# Patient Record
Sex: Male | Born: 1955 | Race: White | Hispanic: No | Marital: Single | Smoking: Former smoker
Health system: Southern US, Community
[De-identification: ages and names within clinical notes are randomized; demographics above are authoritative.]

## PROBLEM LIST (undated history)

## (undated) DIAGNOSIS — I10 Essential (primary) hypertension: Secondary | ICD-10-CM

## (undated) DIAGNOSIS — I5020 Unspecified systolic (congestive) heart failure: Secondary | ICD-10-CM

## (undated) DIAGNOSIS — E785 Hyperlipidemia, unspecified: Secondary | ICD-10-CM

## (undated) DIAGNOSIS — I214 Non-ST elevation (NSTEMI) myocardial infarction: Secondary | ICD-10-CM

## (undated) DIAGNOSIS — I4891 Unspecified atrial fibrillation: Secondary | ICD-10-CM

## (undated) DIAGNOSIS — I2510 Atherosclerotic heart disease of native coronary artery without angina pectoris: Secondary | ICD-10-CM

## (undated) HISTORY — DX: Non-ST elevation (NSTEMI) myocardial infarction: I21.4

## (undated) HISTORY — DX: Hyperlipidemia, unspecified: E78.5

## (undated) HISTORY — DX: Essential (primary) hypertension: I10

## (undated) HISTORY — DX: Atherosclerotic heart disease of native coronary artery without angina pectoris: I25.10

## (undated) HISTORY — DX: Unspecified atrial fibrillation: I48.91

## (undated) HISTORY — DX: Unspecified systolic (congestive) heart failure: I50.20

---

## 2013-07-31 HISTORY — PX: CARDIAC CATHETERIZATION: SHX172

## 2014-01-01 ENCOUNTER — Emergency Department: Payer: Self-pay | Admitting: Emergency Medicine

## 2014-01-01 LAB — TROPONIN I: Troponin-I: <0.02 ng/mL

## 2014-01-01 LAB — CBC
HCT: 45.3 % (ref 40.0–52.0)
HGB: 15.2 g/dL (ref 13.0–18.0)
MCH: 31.1 pg (ref 26.0–34.0)
MCHC: 33.5 g/dL (ref 32.0–36.0)
MCV: 93 fL (ref 80–100)
PLATELETS: 227 10*3/uL (ref 150–440)
RBC: 4.88 10*6/uL (ref 4.40–5.90)
RDW: 13.3 % (ref 11.5–14.5)
WBC: 11.3 10*3/uL — ABNORMAL HIGH (ref 3.8–10.6)

## 2014-01-01 LAB — BASIC METABOLIC PANEL
Anion Gap: 3 — ABNORMAL LOW (ref 7–16)
BUN: 10 mg/dL (ref 7–18)
CALCIUM: 8.9 mg/dL (ref 8.5–10.1)
CO2: 27 mmol/L (ref 21–32)
CREATININE: 0.83 mg/dL (ref 0.60–1.30)
Chloride: 108 mmol/L — ABNORMAL HIGH (ref 98–107)
EGFR (African American): >60
EGFR (Non-African Amer.): >60
GLUCOSE: 102 mg/dL — AB (ref 65–99)
OSMOLALITY: 275 (ref 275–301)
POTASSIUM: 3.7 mmol/L (ref 3.5–5.1)
Sodium: 138 mmol/L (ref 136–145)

## 2014-01-01 LAB — URINALYSIS, COMPLETE
BILIRUBIN, UR: NEGATIVE
Bacteria: NONE SEEN
GLUCOSE, UR: NEGATIVE mg/dL (ref 0–75)
Ketone: NEGATIVE
LEUKOCYTE ESTERASE: NEGATIVE
Nitrite: NEGATIVE
Ph: 6 (ref 4.5–8.0)
Protein: NEGATIVE
SPECIFIC GRAVITY: 1.003 (ref 1.003–1.030)
SQUAMOUS EPITHELIAL: NONE SEEN

## 2014-02-28 HISTORY — PX: CORONARY ARTERY BYPASS GRAFT: SHX141

## 2014-03-20 ENCOUNTER — Inpatient Hospital Stay: Payer: Self-pay | Admitting: Cardiology

## 2014-03-20 LAB — BASIC METABOLIC PANEL
Anion Gap: 10 (ref 7–16)
BUN: 13 mg/dL (ref 7–18)
CO2: 23 mmol/L (ref 21–32)
Calcium, Total: 8 mg/dL — ABNORMAL LOW (ref 8.5–10.1)
Chloride: 108 mmol/L — ABNORMAL HIGH (ref 98–107)
Creatinine: 0.81 mg/dL (ref 0.60–1.30)
EGFR (Non-African Amer.): >60
Glucose: 130 mg/dL — ABNORMAL HIGH (ref 65–99)
Osmolality: 283 (ref 275–301)
POTASSIUM: 3.2 mmol/L — AB (ref 3.5–5.1)
Sodium: 141 mmol/L (ref 136–145)

## 2014-03-20 LAB — CBC
HCT: 40.1 % (ref 40.0–52.0)
HGB: 13.6 g/dL (ref 13.0–18.0)
MCH: 30.7 pg (ref 26.0–34.0)
MCHC: 33.9 g/dL (ref 32.0–36.0)
MCV: 90 fL (ref 80–100)
Platelet: 285 10*3/uL (ref 150–440)
RBC: 4.43 10*6/uL (ref 4.40–5.90)
RDW: 13.2 % (ref 11.5–14.5)
WBC: 12.1 10*3/uL — ABNORMAL HIGH (ref 3.8–10.6)

## 2014-03-20 LAB — PROTIME-INR
INR: 1
Prothrombin Time: 13.2 secs (ref 11.5–14.7)

## 2014-03-20 LAB — TROPONIN I
TROPONIN-I: 0.17 ng/mL — AB
Troponin-I: <0.02 ng/mL

## 2014-03-20 LAB — APTT: Activated PTT: 32.6 secs (ref 23.6–35.9)

## 2014-03-20 LAB — CK-MB: CK-MB: 2.6 ng/mL (ref 0.5–3.6)

## 2014-03-25 DIAGNOSIS — I2510 Atherosclerotic heart disease of native coronary artery without angina pectoris: Secondary | ICD-10-CM

## 2014-03-25 HISTORY — DX: Atherosclerotic heart disease of native coronary artery without angina pectoris: I25.10

## 2014-09-10 ENCOUNTER — Ambulatory Visit: Payer: Self-pay | Admitting: Internal Medicine

## 2014-11-06 ENCOUNTER — Ambulatory Visit
Admit: 2014-11-06 | Disposition: A | Discharge status: Home / Self Care | Payer: Self-pay | Attending: Internal Medicine | Admitting: Internal Medicine

## 2014-11-21 NOTE — Consult Note (Signed)
PATIENT NAME:  James [name], James [name] MR#:  [id number] DATE OF BIRTH:  Jun 27, 1956  DATE OF CONSULTATION:  03/20/2014  PRIMARY CARE PHYSICIAN:  Dr.  Thedore MinsSingh    CONSULTING PHYSICIAN:  Marcina MillardAlexander Toa Mia, MD  CHIEF COMPLAINT: Chest pain.   HISTORY OF PRESENT ILLNESS: The patient is a 59 year old gentleman with history of hypertension.  The patient has no prior cardiac history. He was in his usual state of health until today when he experienced substernal chest discomfort and presented to [facility] Room.  EKG revealed ST elevations in the anterior leads consistent with ST elevation myocardial infarction.   PAST MEDICAL HISTORY:  Hypertension.   MEDICATIONS: The patient reports blood pressure medication.   SOCIAL HISTORY: The patient is single.  The patient does smoke a pack of cigarettes a day.   FAMILY HISTORY: No immediate family history of coronary artery disease or myocardial infarction.   REVIEW OF SYSTEMS:  CONSTITUTIONAL: No fever or chills.  EYES: No blurry vision.  EARS: No hearing loss.  RESPIRATORY: No shortness of breath.  CARDIOVASCULAR: Chest pain as described above.  GASTROINTESTINAL: No nausea, vomiting, or diarrhea.  GENITOURINARY: No dysuria or hematuria.  ENDOCRINE: No polyuria or polydipsia.  MUSCULOSKELETAL: No arthralgias or myalgias.  NEUROLOGICAL: No focal muscle weakness or numbness.  PSYCHOLOGICAL: No depression or anxiety.   PHYSICAL EXAMINATION:  VITAL SIGNS: Blood pressure 122/67, pulse 84.  HEENT: Pupils equal and reactive to light and accommodation.  NECK: Supple without thyromegaly.  LUNGS: Clear.  CARDIOVASCULAR:  Normal JVP. Normal PMI. Regular rate and rhythm. Normal S1, S2. No appreciable gallop, murmur, or rub.  ABDOMEN: Soft and nontender. Pulses were intact bilaterally.  MUSCULOSKELETAL: Normal muscle tone.  NEUROLOGIC: The patient is alert and oriented x 3. Motor and sensory both grossly intact.   IMPRESSION: A 59 year old gentleman with new  onset chest pain with EKG consistent with anterior ST elevation myocardial infarction.   RECOMMENDATIONS:   Proceed directly to cardiac catheterization for immediate angiogram and possible primary percutaneous coronary intervention.  The risks, benefits, and alternatives were explained to the patient and informed written consent was obtained.      ____________________________ Marcina MillardAlexander Jamoni Broadfoot, MD ap:DT D: 03/20/2014 15:53:58 ET T: 03/20/2014 16:40:06 ET JOB#: [id number]  cc: Marcina MillardAlexander Zaryia Markel, MD, <Dictator> Marcina MillardALEXANDER Garek Schuneman MD ELECTRONICALLY SIGNED 04/02/2014 16:00

## 2014-11-21 NOTE — Discharge Summary (Signed)
PATIENT NAME:  James [name], James [name] MR#:  [id number] DATE OF BIRTH:  05-22-1956  DATE OF ADMISSION:  03/20/2014 DATE OF DISCHARGE:  03/20/2014  PRIMARY CARE PHYSICIAN: Dr. Thedore MinsSingh  FINAL DIAGNOSES: 1.  Anterior ST-elevation myocardial infarction.  2.  Hypertension.   PROCEDURE: Cardiac catheterization on 03/20/2014.   HISTORY OF PRESENT ILLNESS: Please see admission [name] and P.   HOSPITAL COURSE: The patient presented to [facility] Room on 03/20/2014 with prolonged episode of chest pain with EKG revealing ST elevations in the anterior leads consistent with ST-elevation myocardial infarction. Cardiac catheterization was performed, which revealed high-grade stenosis in the distal left main and ostium of the left anterior descending coronary artery with TIMI-3 flow. It was felt that the patient would be best treated with coronary artery bypass graft surgery. The [facility] in [facility] condition.   ____________________________ Marcina MillardAlexander [name], MD ap:sb D: 04/16/2014 10:33:38 ET T: 04/16/2014 12:04:46 ET JOB#: [id number]  cc: Marcina MillardAlexander Kwan Shellhammer, MD, <Dictator> Marcina MillardALEXANDER Jazmene Racz MD ELECTRONICALLY SIGNED 04/28/2014 12:15

## 2016-02-16 IMAGING — CT CT HEAD WITHOUT CONTRAST
1 series · 16 of 30 positions shown, 20 images · non-contrast
Comparison: None.

CLINICAL DATA: Dizziness

EXAM:
CT HEAD WITHOUT CONTRAST
TECHNIQUE: Contiguous axial images were obtained from the base of the skull
through the vertex without intravenous contrast.

[Series 2: head wo · axial · 0.42mm/px · z∈[+237,+363]mm · 16 of 32 slices shown, 20 images]
[im 2/32  brain]
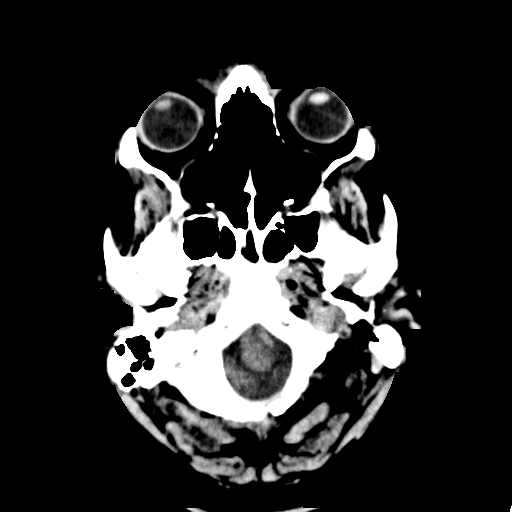
[im 2/32  bone]
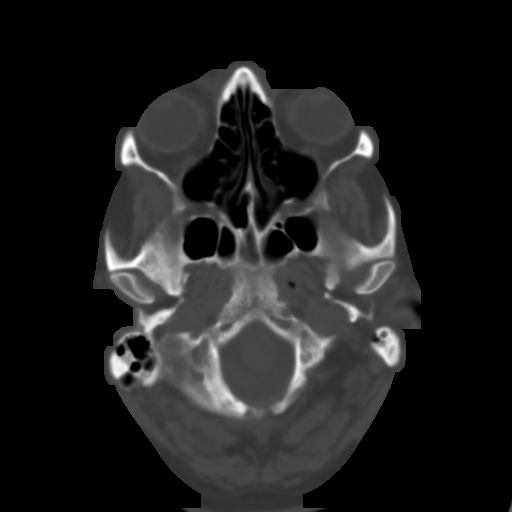
[im 4/32  brain]
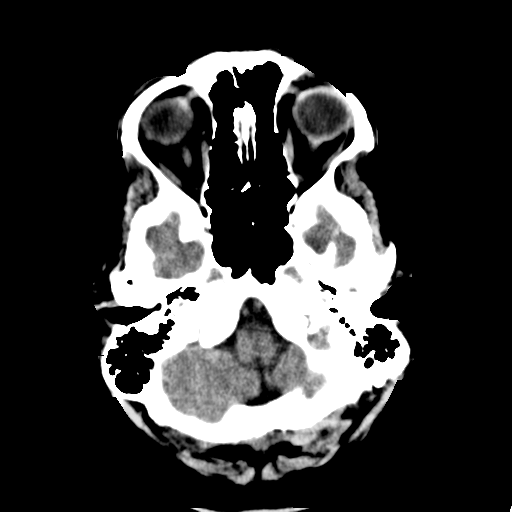
[im 6/32  brain]
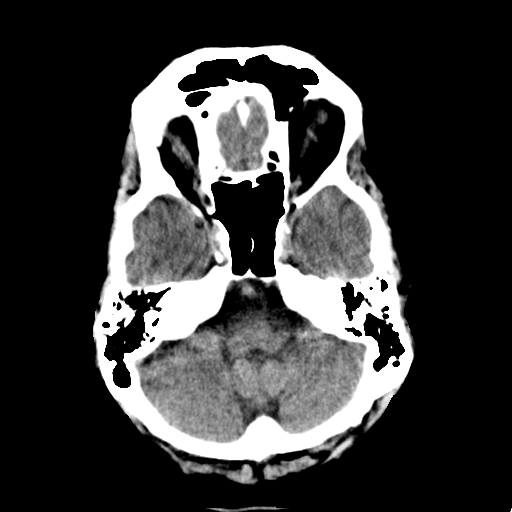
[im 8/32  brain]
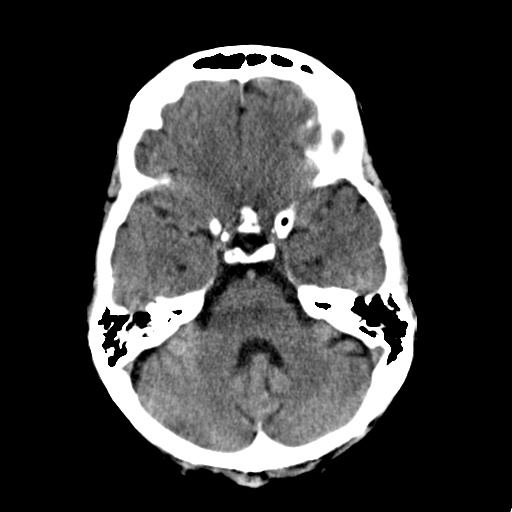
[im 9/32  brain]
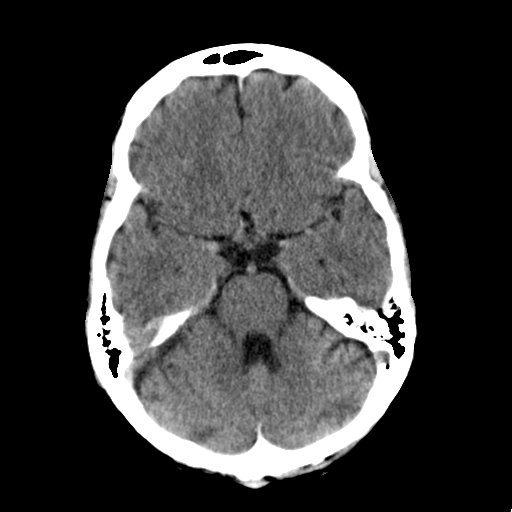
[im 9/32  bone]
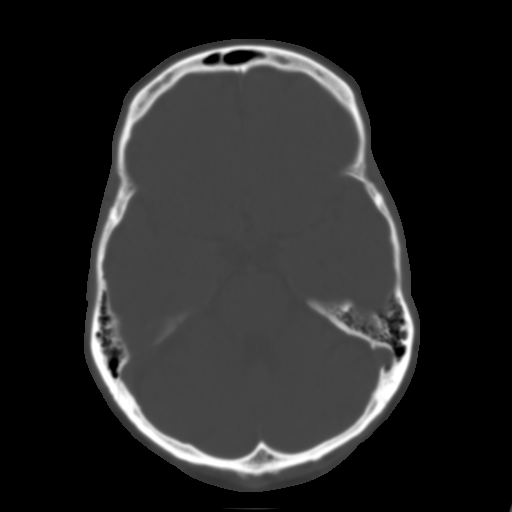
[im 11/32  brain]
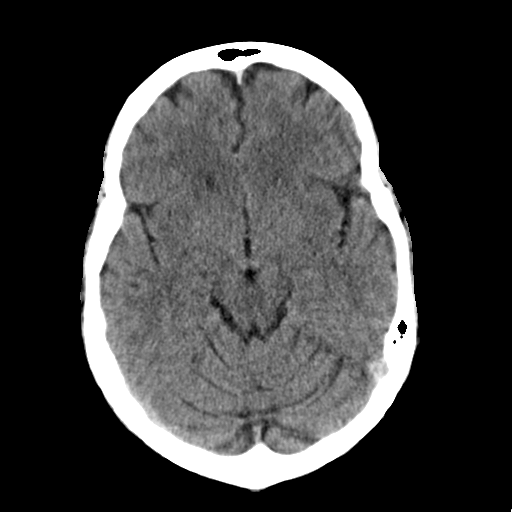
[im 13/32  brain]
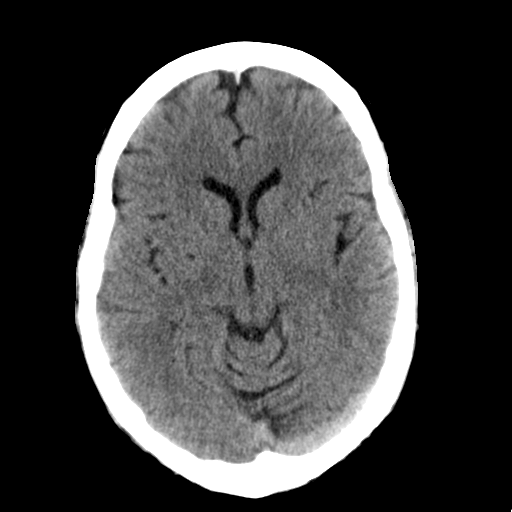
[im 15/32  brain]
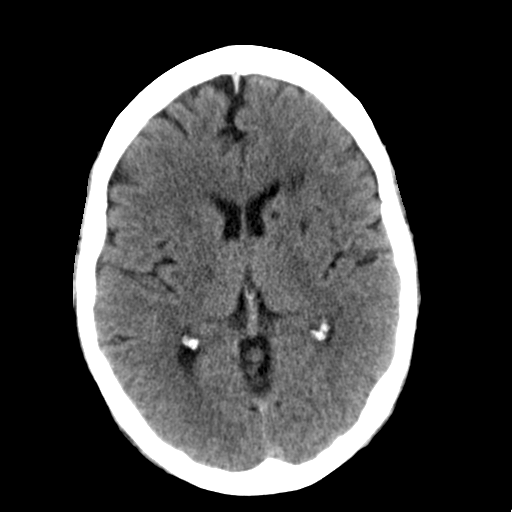
[im 17/32  brain]
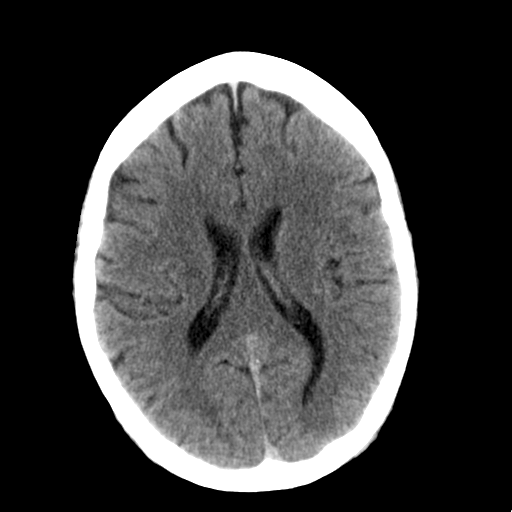
[im 17/32  bone]
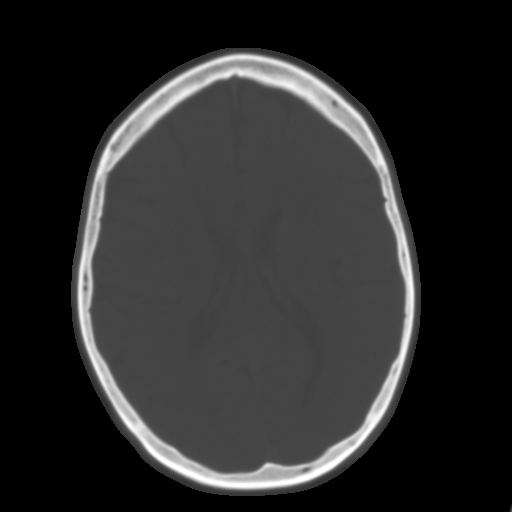
[im 19/32  brain]
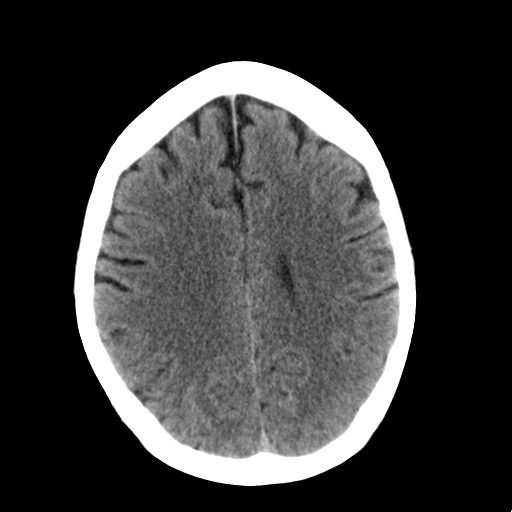
[im 21/32  brain]
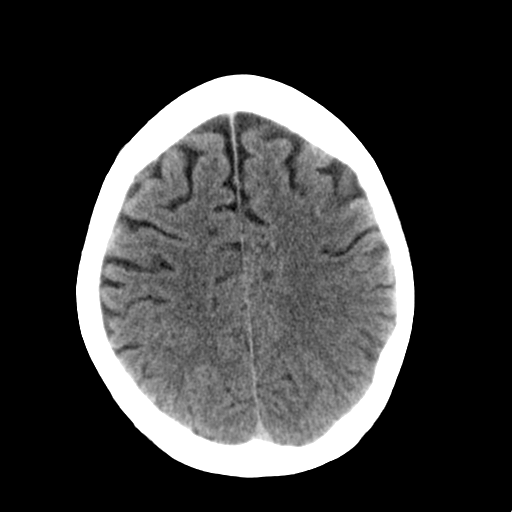
[im 23/32  brain]
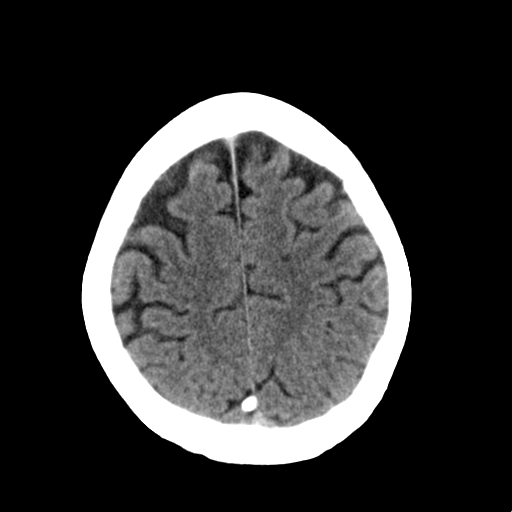
[im 24/32  brain]
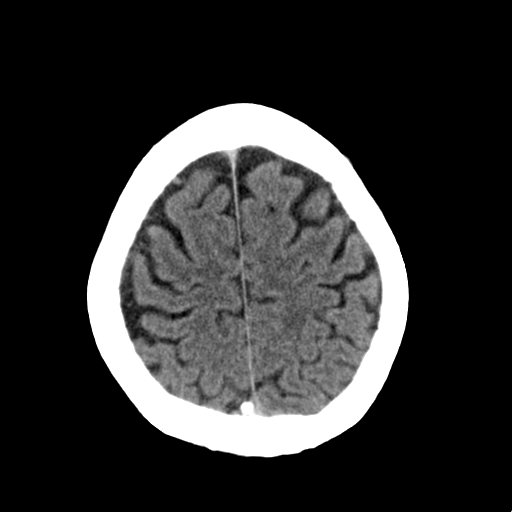
[im 24/32  bone]
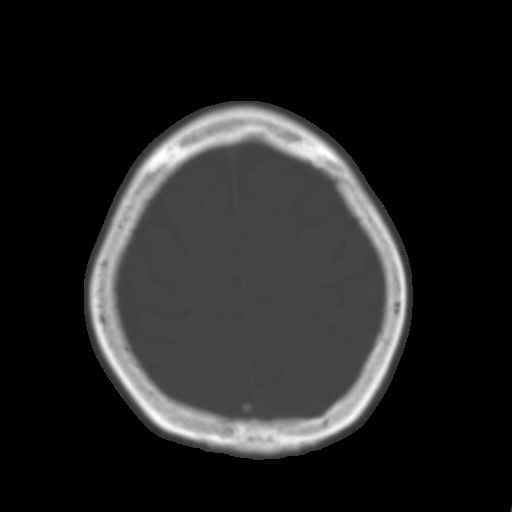
[im 26/32  brain]
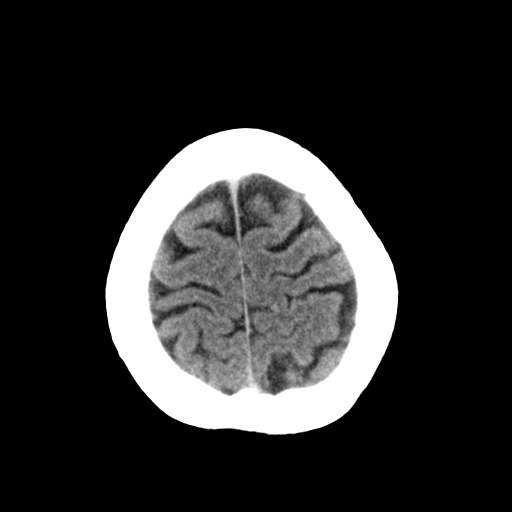
[im 28/32  brain]
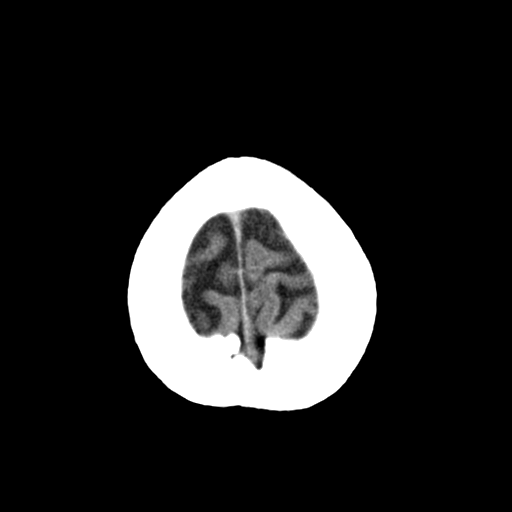
[im 30/32  brain]
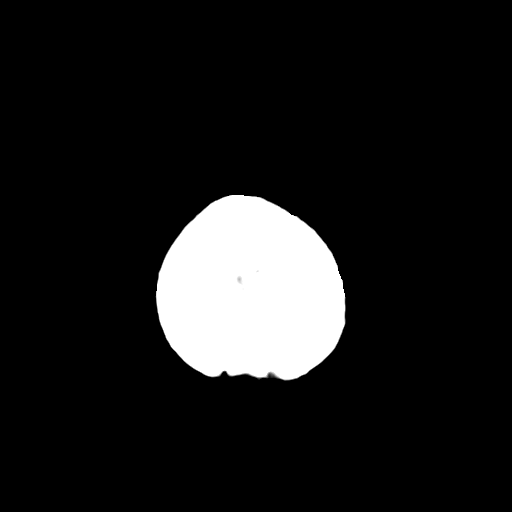

[16 of 30 positions shown; findings below may reference images not displayed]

FINDINGS: There is no evidence of mass effect, midline shift or extra-axial
fluid collections. There is no evidence of a space-occupying lesion
or intracranial hemorrhage. There is no evidence of a cortical-based
area of acute infarction. Left periventricular white matter low
attenuation and basal ganglia low attenuation, likely representing
old lacunar infarcts.

The ventricles and sulci are appropriate for the patient's age. The
basal cisterns are patent.

Visualized portions of the orbits are unremarkable. The visualized
portions of the paranasal sinuses and mastoid air cells are
unremarkable.

The osseous structures are unremarkable.
IMPRESSION: No acute intracranial pathology.

## 2016-05-04 IMAGING — CR DG CHEST 1V PORT
1 series · 2 of 2 positions shown · non-contrast
Comparison: None.

CLINICAL DATA: Chest pain status post heart catheterization

EXAM:
PORTABLE CHEST - 1 VIEW

[Series 1: ap · 0.17mm/px · 2 of 2 slices shown]
[im 1/2]
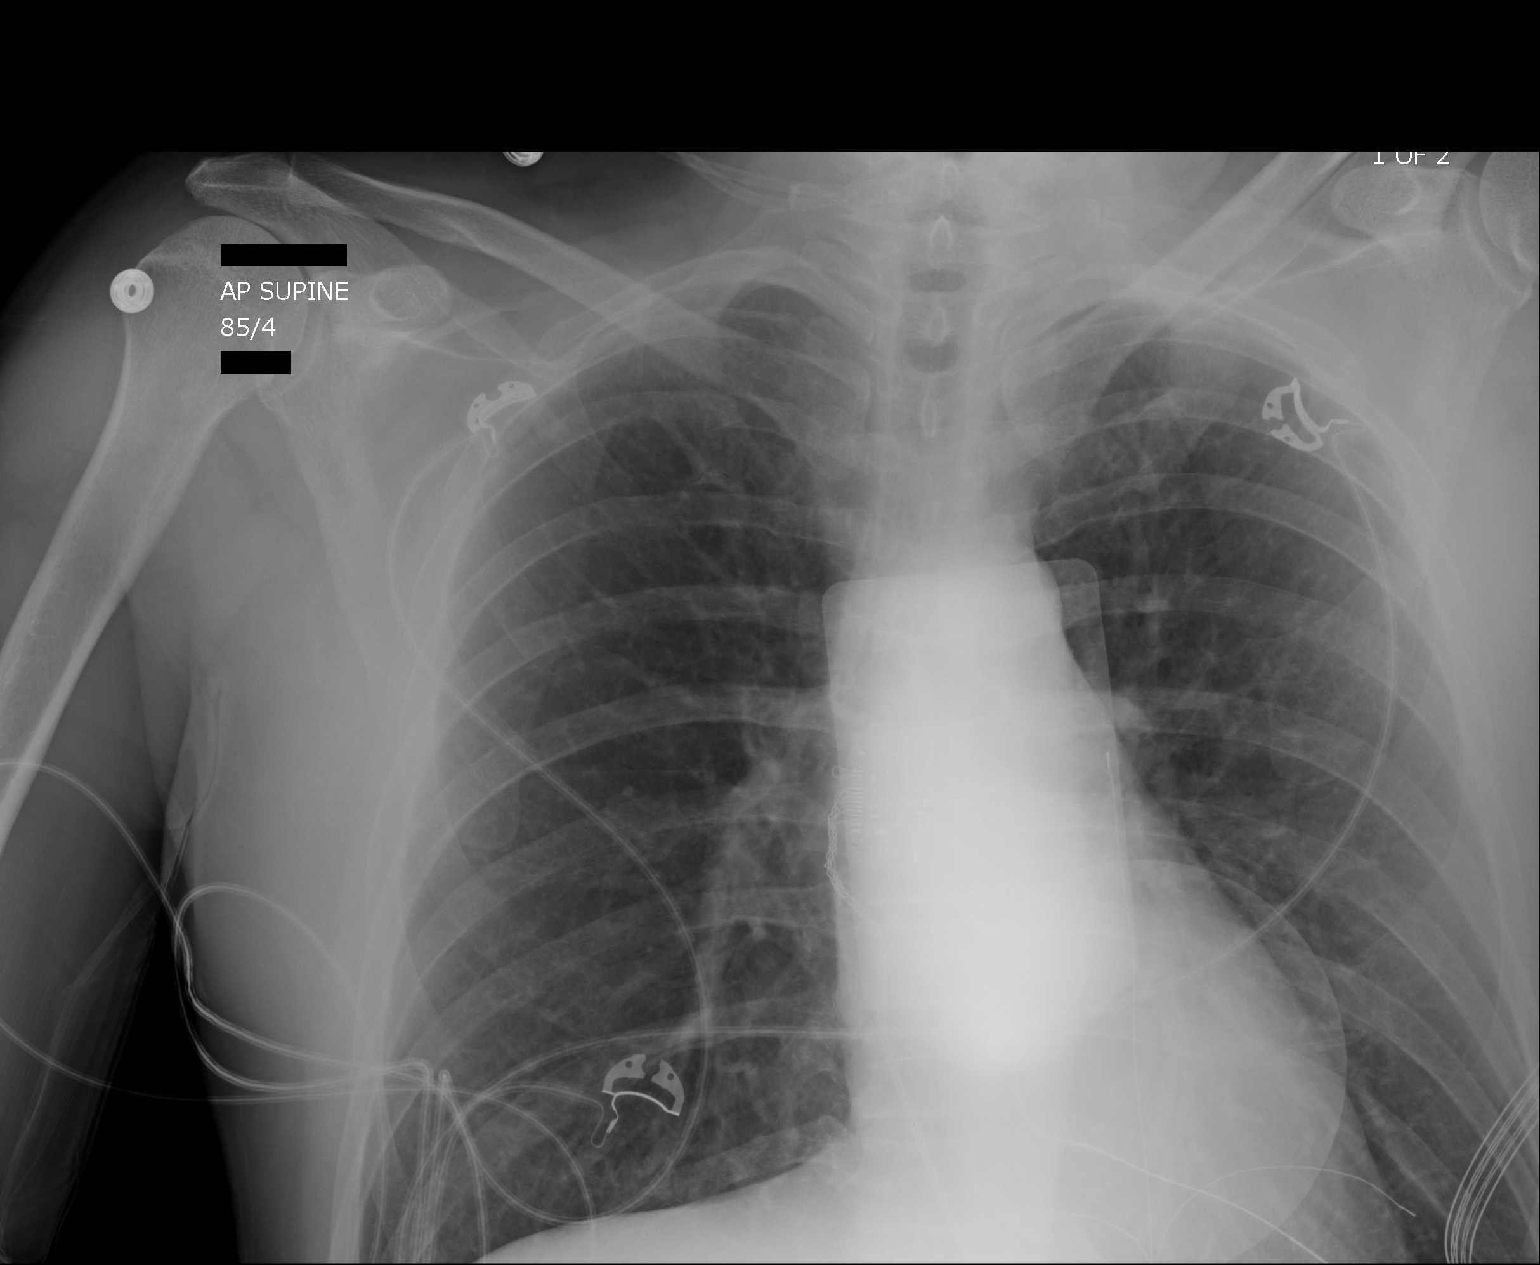
[im 2/2]
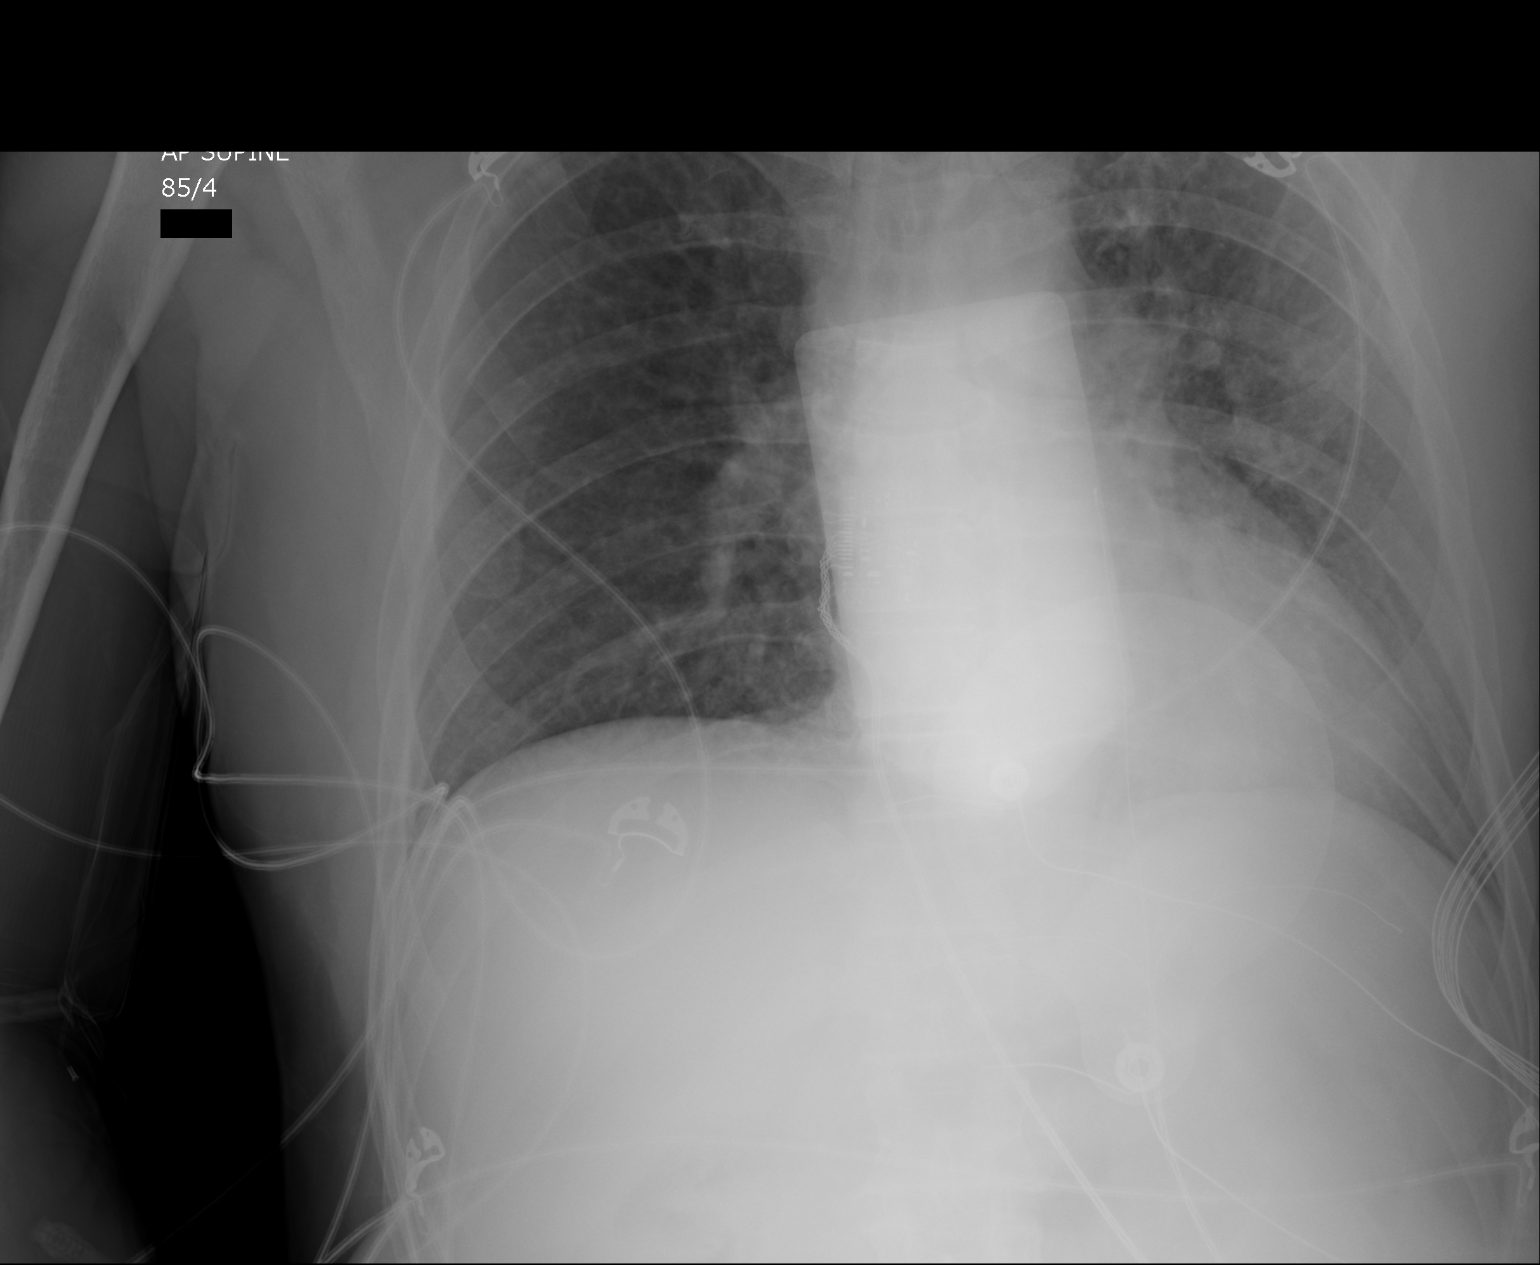

[2 of 2 positions shown; findings below may reference images not displayed]

FINDINGS: The lungs are well-expanded. There is no focal infiltrate. There is
no pneumothorax or pleural effusion. The cardiac silhouette is
top-normal in size. The pulmonary vascularity is not engorged. The
mediastinum is normal in width. External pacing pads are present.
The observed bony thorax is unremarkable.
IMPRESSION: There is no acute cardiopulmonary abnormality.

## 2016-10-15 NOTE — Progress Notes (Signed)
Cardiology Office Note  Date:  10/16/2016   ID:  James [name], DOB [birth year], MRN [id number]  PCP:  James ShamesSingh,[name], MD   Chief Complaint  Patient presents with  . other    Former Dr. Gwen PoundsKowalski patient, would like to establish care for a Hx. 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 Denies chest pain or shortness of breath.    HPI:  The patient is a 61 year old gentleman with history of hypertension, paroxysmal atrial fibrillation in February 2016, smoker 40 years,  experienced substernal chest discomfort 03/20/14 and presented to [facility],  EKG revealed ST elevations in the anterior leads consistent with ST elevation myocardial infarction,   Cardiac catheterization was performed, which revealed high-grade stenosis in the distal left main and ostium of the left anterior descending coronary artery with TIMI-3 flow.  [facility] with [facility] artery [facility]. He presents by self-referral for Follow-up of his coronary artery disease and DOT clearance  Home BP is good, home 130/80 Blood pressure mildly elevated on today's visit He denies any significant chest pain or shortness of breath on exertion Main reason he presents today is for echocardiogram and routine treadmill study for DOT clearance Reports he feels fine, tolerating his medications  He denies any  Recent symptoms concerning for paroxysmal atrial fibrillation When atrial fib first happened, did not feel it, "but now kind of recognize it, but could sleep through it"  Was previously on anticoagulation, eliquis but was unable to afford this long-term Denies any bleeding complications CHADS VASC of 3 Even if he was able to afford the medication, he is not sure if he would want to be on it Not particular interested in warfarin His father is on anticoagulation  Elevated PSA of 7.5 in February 2018 significantly increased from prior PSA of 4.75 in June 2015. lipid  profile without statin, LDL 56, HDL 39 and total cholesterol 133. Echocardiogram May 2017 shows an EF of 40%. By echo   EKG on today's visit shows normal sinus rhythm rate 68 bpm old anterior MI T-wave abnormality to the precordial leads, no significant change from EKG in 2016   PMH:   has a past medical history of A-fib (HCC); Coronary artery disease (03/25/2014); Hyperlipidemia; Hypertension; NSTEMI (non-ST elevated myocardial infarction) (HCC); and Systolic dysfunction with heart failure (HCC).  PSH:    Past Surgical History:  Procedure Laterality Date  . CARDIAC CATHETERIZATION  2015  . CORONARY ARTERY BYPASS GRAFT  02/2014   [facility]    Current Outpatient Prescriptions  Medication Sig Dispense Refill  . amLODipine (NORVASC) 5 MG tablet Take 1 tablet (5 mg total) by mouth daily. 90 tablet 3  . aspirin EC 81 MG tablet Take 81 mg by mouth daily.     Marland Kitchen. diltiazem (CARDIZEM CD) 120 MG 24 hr capsule Take 1 capsule (120 mg total) by mouth daily. 90 capsule 3  . metoprolol (LOPRESSOR) 50 MG tablet Take 1 tablet (50 mg total) by mouth 2 (two) times daily. 180 tablet 3  . sildenafil (REVATIO) 20 MG tablet Take as directed    . telmisartan (MICARDIS) 40 MG tablet Take 40 mg by mouth daily.     Marland Kitchen. atorvastatin (LIPITOR) 10 MG tablet Take 1 tablet (10 mg total) by mouth daily. 90 tablet 3   No current facility-administered medications for this visit.      Allergies:   Codeine   Social History:  The patient  reports that he quit smoking about 2 years ago. His smoking use included Cigarettes. He has a 40.00 pack-year smoking history. He has never used smokeless tobacco. He reports that he drinks alcohol. He reports that he does not use drugs.   Family History:   family history includes Diabetes gravidarum in his brother and father; Heart attack (age of onset: 85) in his father; Heart disease in his father.    Review of Systems: Review of Systems  Constitutional: Negative.   Respiratory:  Negative.   Cardiovascular: Negative.   Gastrointestinal: Negative.   Musculoskeletal: Negative.   Neurological: Negative.   Psychiatric/Behavioral: Negative.   All other systems reviewed and are negative.    PHYSICAL EXAM: VS:  BP (!) 132/92 (BP Location: Left Arm, Patient Position: Sitting, Cuff Size: Normal)   Pulse 68   Ht 5' 9.5" (1.765 m)   Wt 190 lb 8 oz (86.4 kg)   BMI 27.73 kg/m  , BMI Body mass index is 27.73 kg/m. GEN: Well nourished, well developed, in no acute distress  HEENT: normal  Neck: no JVD, carotid bruits, or masses Cardiac: RRR; no murmurs, rubs, or gallops,no edema  Respiratory:  clear to auscultation bilaterally, normal work of breathing GI: soft, nontender, nondistended, + BS MS: no deformity or atrophy  Skin: warm and dry, no rash Neuro:  Strength and sensation are intact Psych: euthymic mood, full affect    Recent Labs: No results found for requested labs within last 8760 hours.    Lipid Panel No results found for: CHOL, HDL, LDLCALC, TRIG    Wt Readings from Last 3 Encounters:  10/16/16 190 lb 8 oz (86.4 kg)       ASSESSMENT AND PLAN:  Acute on chronic combined systolic and diastolic CHF (congestive heart failure) (HCC) - Plan: EKG 12-Lead, Exercise Tolerance Test, ECHOCARDIOGRAM COMPLETE Appears euvolemic on today's visit He is on ARB, beta blocker Calcium channel blockers previously started by outside cardiologist  Hypertension, unspecified type - Plan: EKG 12-Lead, Exercise Tolerance Test, ECHOCARDIOGRAM COMPLETE Discussed his regiment with him, no changes made today as it seems to be working for him Discussed that he is on 2 calcium channel blockers  Encounter for examination required by Department of Transportation (DOT) - Plan: Exercise Tolerance Test, ECHOCARDIOGRAM COMPLETE Echocardiogram and stress test has been ordered  Coronary artery disease of bypass graft of native heart with stable angina pectoris [facility]) Long  discussion concerning his previous surgery in presentation to the hospital in 2015. He denies any anginal symptoms  Mixed hyperlipidemia Recommended he start Lipitor 10 mg daily for the Benefits of statins on unstable plaque  Smoking history Stop smoking several years ago  Paroxysmal atrial fibrillation Denies any palpitations or symptoms concerning for arrhythmia CHADS VASC of 3 He has declined anticoagulation at this time  Encounter for anticoagulation discussion and counseling We have provided him with information and long discussion today concerning risk and benefit of anticoagulation. Recommended he call us if he would like to start anticoagulation. He is aware of his risk of stroke.  Disposition:   F/U  6 months   Total encounter time more than 45 minutes  Greater than 50% was spent in counseling and coordination of care with the patient    Orders Placed This Encounter  Procedures  . Exercise Tolerance Test  . EKG 12-Lead  . ECHOCARDIOGRAM COMPLETE     Signed, Dossie Arbour, M.D., Ph.D. 10/16/2016  [facility] [place], [place] [phone]

## 2016-10-16 ENCOUNTER — Ambulatory Visit (INDEPENDENT_AMBULATORY_CARE_PROVIDER_SITE_OTHER): Payer: BLUE CROSS/BLUE SHIELD | Admitting: Cardiovascular Disease

## 2016-10-16 ENCOUNTER — Encounter: Payer: Self-pay | Admitting: Cardiovascular Disease

## 2016-10-16 VITALS — BP 132/92 | HR 68 | Ht 69.5 in | Wt 190.5 lb

## 2016-10-16 DIAGNOSIS — I480 Paroxysmal atrial fibrillation: Secondary | ICD-10-CM

## 2016-10-16 DIAGNOSIS — I5043 Acute on chronic combined systolic (congestive) and diastolic (congestive) heart failure: Secondary | ICD-10-CM | POA: Diagnosis not present

## 2016-10-16 DIAGNOSIS — Z0289 Encounter for other administrative examinations: Secondary | ICD-10-CM

## 2016-10-16 DIAGNOSIS — I10 Essential (primary) hypertension: Secondary | ICD-10-CM

## 2016-10-16 DIAGNOSIS — I25708 Atherosclerosis of coronary artery bypass graft(s), unspecified, with other forms of angina pectoris: Secondary | ICD-10-CM | POA: Diagnosis not present

## 2016-10-16 DIAGNOSIS — Z87891 Personal history of nicotine dependence: Secondary | ICD-10-CM

## 2016-10-16 DIAGNOSIS — E782 Mixed hyperlipidemia: Secondary | ICD-10-CM

## 2016-10-16 DIAGNOSIS — Z7189 Other specified counseling: Secondary | ICD-10-CM | POA: Diagnosis not present

## 2016-10-16 MED ORDER — ATORVASTATIN CALCIUM 10 MG PO TABS: 10.0000 mg | ORAL_TABLET | Freq: Every day | ORAL | 3 refills | Status: DC

## 2016-10-16 MED ORDER — DILTIAZEM HCL ER COATED BEADS 120 MG PO CP24: 120.0000 mg | ORAL_CAPSULE | Freq: Every day | ORAL | 3 refills | Status: DC

## 2016-10-16 MED ORDER — METOPROLOL TARTRATE 50 MG PO TABS: 50.0000 mg | ORAL_TABLET | Freq: Two times a day (BID) | ORAL | 3 refills | Status: DC

## 2016-10-16 MED ORDER — AMLODIPINE BESYLATE 5 MG PO TABS: 5.0000 mg | ORAL_TABLET | Freq: Every day | ORAL | 3 refills | Status: DC

## 2016-10-16 NOTE — Patient Instructions (Addendum)
Research the "CHADS VASC" formula Consider starting blood thinner for atrial fibrillation   Medication Instructions:   Please start atorvastatin one a day  Consider two baby aspirin  Labwork:  No new labs needed  Testing/Procedures:  We will order a treadmill stress for DOT physical Your physician has requested that you have an exercise tolerance test. For further information please visit [email or web address] Please also follow instruction sheet, as given.  We will order an echo for CAD, CABG, systolic dysfunction and for DOT physical Echocardiography is a painless test that uses sound waves to create images of your heart. It provides your doctor with information about the size and shape of your heart and how well your heart's chambers and valves are working. This procedure takes approximately one hour. There are no restrictions for this procedure.  I recommend watching educational videos on topics of interest to you at:       [email or web address]  Enter code: HEARTCARE    Follow-Up: It was a pleasure seeing you in the office today. Please call us if you have new issues that need to be addressed before your next appt.  [phone]  Your physician wants you to follow-up in: 6 months.  You will receive a reminder letter in the mail two months in advance. If you don't receive a letter, please call our office to schedule the follow-up appointment.  If you need a refill on your cardiac medications before your next appointment, please call your pharmacy.      Exercise Stress Electrocardiogram An exercise stress electrocardiogram is a test that is done to evaluate the blood supply to your heart. This test may also be called exercise stress electrocardiography. The test is done while you are walking on a treadmill. The goal of this test is to raise your heart rate. This test is done to find areas of poor blood flow to the heart by determining the extent of coronary artery disease  (CAD). CAD is defined as narrowing in one or more heart (coronary) arteries of more than 70%. If you have an abnormal test result, this may mean that you are not getting adequate blood flow to your heart during exercise. Additional testing may be needed to understand why your test was abnormal. Tell a health care provider about:  Any allergies you have.  All medicines you are taking, including vitamins, herbs, eye drops, creams, and over-the-counter medicines.  Any problems you or family members have had with anesthetic medicines.  Any blood disorders you have.  Any surgeries you have had.  Any medical conditions you have.  Possibility of pregnancy, if this applies. What are the risks? Generally, this is a safe procedure. However, as with any procedure, complications can occur. Possible complications can include:  Pain or pressure in the following areas:  Chest.  Jaw or neck.  Between your shoulder blades.  Radiating down your left arm.  Dizziness or light-headedness.  Shortness of breath.  Increased or irregular heartbeats.  Nausea or vomiting.  Heart attack (rare). What happens before the procedure?  Avoid all forms of caffeine 24 hours before your test or as directed by your health care provider. This includes coffee, tea (even decaffeinated tea), caffeinated sodas, chocolate, cocoa, and certain pain medicines.  Follow your health care provider's instructions regarding eating and drinking before the test.  Take your medicines as directed at regular times with water unless instructed otherwise. Exceptions may include:  If you have diabetes, ask how you are to take your insulin or  pills. It is common to adjust insulin dosing the morning of the test.  If you are taking beta-blocker medicines, it is important to talk to your health care provider about these medicines well before the date of your test. Taking beta-blocker medicines may interfere with the test. In some  cases, these medicines need to be changed or stopped 24 hours or more before the test.  If you wear a nitroglycerin patch, it may need to be removed prior to the test. Ask your health care provider if the patch should be removed before the test.  If you use an inhaler for any breathing condition, bring it with you to the test.  If you are an outpatient, bring a snack so you can eat right after the stress phase of the test.  Do not smoke for 4 hours prior to the test or as directed by your health care provider.  Do not apply lotions, powders, creams, or oils on your chest prior to the test.  Wear loose-fitting clothes and comfortable shoes for the test. This test involves walking on a treadmill. What happens during the procedure?  Multiple patches (electrodes) will be put on your chest. If needed, small areas of your chest may have to be shaved to get better contact with the electrodes. Once the electrodes are attached to your body, multiple wires will be attached to the electrodes and your heart rate will be monitored.  Your heart will be monitored both at rest and while exercising.  You will walk on a treadmill. The treadmill will be started at a slow pace. The treadmill speed and incline will gradually be increased to raise your heart rate. What happens after the procedure?  Your heart rate and blood pressure will be monitored after the test.  You may return to your normal schedule including diet, activities, and medicines, unless your health care provider tells you otherwise. This information is not intended to replace advice given to you by your health care provider. Make sure you discuss any questions you have with your health care provider. Document Released: 07/14/2000 Document Revised: 12/23/2015 Document Reviewed: 03/24/2013 Elsevier Interactive Patient Education  2017 Elsevier [employer]. Echocardiogram An echocardiogram, or echocardiography, uses sound waves (ultrasound) to produce  an image of your heart. The echocardiogram is simple, painless, obtained within a short period of time, and offers valuable information to your health care provider. The images from an echocardiogram can provide information such as:  Evidence of coronary artery disease (CAD).  Heart size.  Heart muscle function.  Heart valve function.  Aneurysm detection.  Evidence of a past heart attack.  Fluid buildup around the heart.  Heart muscle thickening.  Assess heart valve function. Tell a health care provider about:  Any allergies you have.  All medicines you are taking, including vitamins, herbs, eye drops, creams, and over-the-counter medicines.  Any problems you or family members have had with anesthetic medicines.  Any blood disorders you have.  Any surgeries you have had.  Any medical conditions you have.  Whether you are pregnant or may be pregnant. What happens before the procedure? No special preparation is needed. Eat and drink normally. What happens during the procedure?  In order to produce an image of your heart, gel will be applied to your chest and a wand-like tool (transducer) will be moved over your chest. The gel will help transmit the sound waves from the transducer. The sound waves will harmlessly bounce off your heart to allow the heart images  to be captured in real-time motion. These images will then be recorded.  You may need an IV to receive a medicine that improves the quality of the pictures. What happens after the procedure? You may return to your normal schedule including diet, activities, and medicines, unless your health care provider tells you otherwise. This information is not intended to replace advice given to you by your health care provider. Make sure you discuss any questions you have with your health care provider. Document Released: 07/14/2000 Document Revised: 03/04/2016 Document Reviewed: 03/24/2013 Elsevier Interactive Patient Education   2017 [employer].

## 2016-11-06 ENCOUNTER — Other Ambulatory Visit: Payer: Self-pay | Admitting: Cardiovascular Disease

## 2016-11-06 DIAGNOSIS — I5043 Acute on chronic combined systolic (congestive) and diastolic (congestive) heart failure: Secondary | ICD-10-CM

## 2016-11-16 ENCOUNTER — Other Ambulatory Visit: Payer: Self-pay

## 2016-11-16 ENCOUNTER — Ambulatory Visit (INDEPENDENT_AMBULATORY_CARE_PROVIDER_SITE_OTHER): Payer: BLUE CROSS/BLUE SHIELD

## 2016-11-16 DIAGNOSIS — I10 Essential (primary) hypertension: Secondary | ICD-10-CM | POA: Diagnosis not present

## 2016-11-16 DIAGNOSIS — I5043 Acute on chronic combined systolic (congestive) and diastolic (congestive) heart failure: Secondary | ICD-10-CM

## 2016-11-16 DIAGNOSIS — Z0289 Encounter for other administrative examinations: Secondary | ICD-10-CM | POA: Diagnosis not present

## 2016-11-21 LAB — EXERCISE TOLERANCE TEST
CHL CUP MPHR: 159 {beats}/min
CSEPEDS: 26 s
Estimated workload: 9.2 METS
Exercise duration (min): 7 min
Peak HR: 137 {beats}/min
Percent HR: 86 %
Rest HR: 68 {beats}/min

## 2016-11-24 ENCOUNTER — Telehealth: Payer: Self-pay | Admitting: Cardiovascular Disease

## 2016-11-24 NOTE — Telephone Encounter (Signed)
Please send over stress test and echo results to occupational health   [phone] fax   Waiting on patient to call back for verbal roi form to be completed.

## 2016-11-24 NOTE — Telephone Encounter (Signed)
Spoke with patient . Verified patient using 3 identifiers.  Confirmed he wants these results sent to [facility] via fax.     Scanned roi .  Sent records.

## 2017-02-26 ENCOUNTER — Telehealth: Payer: Self-pay | Admitting: Cardiovascular Disease

## 2017-02-26 NOTE — Telephone Encounter (Signed)
Pharmacist would like to verify pt metoprolol dose. Please call.

## 2017-02-26 NOTE — Telephone Encounter (Signed)
S/w pharmacist. According to patient, he has been taking Metoprolol 100 mg BID ongoing.  He was previous patient of James [facility]alski at [facility] and established with James [name] 10/16/16. However, at office visit on 10/16/16, it was noted patient taking Metoprolol 50 mg BID. Last office visit with James Gwen PoundsKowalski on 04/07/16 noted patient taking Metoprolol 100 mg BID. Pharmacist states patient's pressures are running in the 130's systolic and well controlled. Routing to James [name], Is it ok to refill Metoprolol at 100 mg BID?

## 2017-02-27 MED ORDER — METOPROLOL TARTRATE 100 MG PO TABS: 100.0000 mg | ORAL_TABLET | Freq: Two times a day (BID) | ORAL | 3 refills | Status: DC

## 2017-02-27 NOTE — Telephone Encounter (Signed)
Okay to send in 100 twice a day Can we updated her medication list

## 2017-02-27 NOTE — Telephone Encounter (Signed)
Spoke with patient regarding medication increase and sent in to pharmacy of choice. He verbalized understanding of change and instructions with no further questions at this time.

## 2017-03-02 ENCOUNTER — Telehealth: Payer: Self-pay | Admitting: Cardiovascular Disease

## 2017-03-02 MED ORDER — METOPROLOL TARTRATE 100 MG PO TABS: 100.0000 mg | ORAL_TABLET | Freq: Two times a day (BID) | ORAL | 3 refills | Status: DC

## 2017-03-02 NOTE — Telephone Encounter (Signed)
Pharmacy did not get updated refill for metoprolol 100 mg po BID Please resend .

## 2017-03-02 NOTE — Telephone Encounter (Signed)
New Rx sent for Metoprolol 100 mg take one tablet twice a day per Dr. Mariah MillingGollan.

## 2017-06-05 ENCOUNTER — Telehealth: Payer: Self-pay | Admitting: Cardiovascular Disease

## 2017-06-05 ENCOUNTER — Other Ambulatory Visit: Payer: Self-pay

## 2017-06-05 MED ORDER — AMLODIPINE BESYLATE 5 MG PO TABS: 5.0000 mg | ORAL_TABLET | Freq: Every day | ORAL | 3 refills | Status: DC

## 2017-06-05 MED ORDER — DILTIAZEM HCL ER COATED BEADS 120 MG PO CP24: 120.0000 mg | ORAL_CAPSULE | Freq: Every day | ORAL | 3 refills | Status: DC

## 2017-06-05 MED ORDER — METOPROLOL TARTRATE 100 MG PO TABS: 100.0000 mg | ORAL_TABLET | Freq: Two times a day (BID) | ORAL | 3 refills | Status: DC

## 2017-06-05 MED ORDER — ATORVASTATIN CALCIUM 10 MG PO TABS: 10.0000 mg | ORAL_TABLET | Freq: Every day | ORAL | 3 refills | Status: DC

## 2017-06-05 NOTE — Telephone Encounter (Signed)
°*  STAT* If patient is at the pharmacy, call can be transferred to refill team.   1. Which medications need to be refilled? (please list name of each medication and dose if known)   Amlodipine 5 mg po daily   Atorvastatin 10 mg po daily  diltiazem 12 mg po daiily   Metoprolol 100 mg po BID    2. Which pharmacy/location (including street and city if local pharmacy) is medication to be sent to?   [facility] [address]  3. Do they need a 30 day or 90 day supply? 90

## 2017-06-05 NOTE — Telephone Encounter (Signed)
Requested Prescriptions   Signed Prescriptions Disp Refills  . metoprolol tartrate (LOPRESSOR) 100 MG tablet 180 tablet 3    Sig: Take 1 tablet (100 mg total) 2 (two) times daily by mouth.    Authorizing Provider: Antonieta IbaGOLLAN, TIMOTHY J    Ordering User: Margrett RudSLAYTON, BRITTANY N  . diltiazem (CARDIZEM CD) 120 MG 24 hr capsule 90 capsule 3    Sig: Take 1 capsule (120 mg total) daily by mouth.    Authorizing Provider: Antonieta IbaGOLLAN, TIMOTHY J    Ordering User: Margrett RudSLAYTON, BRITTANY N  . atorvastatin (LIPITOR) 10 MG tablet 90 tablet 3    Sig: Take 1 tablet (10 mg total) daily by mouth.    Authorizing Provider: Antonieta IbaGOLLAN, TIMOTHY J    Ordering User: Margrett RudSLAYTON, BRITTANY N  . amLODipine (NORVASC) 5 MG tablet 90 tablet 3    Sig: Take 1 tablet (5 mg total) daily by mouth.    Authorizing Provider: Antonieta IbaGOLLAN, TIMOTHY J    Ordering User: Margrett RudSLAYTON, BRITTANY N

## 2017-06-16 ENCOUNTER — Other Ambulatory Visit: Payer: Self-pay | Admitting: Cardiovascular Disease

## 2017-08-11 NOTE — Progress Notes (Signed)
Cardiology Office Note  Date:  08/13/2017   ID:  James [name], DOB [birth year], MRN [id number]  PCP:  Leotis Shames, MD   Chief Complaint  Patient presents with  . other    6 month follow up. Meds reviewed by the pt. verbally. "doing well."    HPI:  The patient is a 62 year old gentleman with history of  hypertension,  paroxysmal atrial fibrillation in February 2016, previously declined anticoagulation smoker 40 years,  Quit  2015 experienced substernal chest discomfort 03/20/14 and presented to [facility] Emergency Room,   EKG revealed ST elevations in the anterior leads consistent with ST elevation myocardial infarction,    Cardiac catheterization was performed,  high-grade stenosis in the distal left main and ostium of the left anterior descending coronary artery with TIMI-3 flow.   CABG at [facility].  March 25, 2014 He presents for Follow-up of his coronary artery disease  Previously required DOT clearance 2018  Denies any significant chest pain or shortness of breath on exertion No PND orthopnea No regular exercise program Reports he is changing primary care,  Last seen by primary care February 2018 Office notes reviewed Was declining colonoscopy, Hemoccult testing performed PSA 7.04 September 2016 with significant increase from June 2015, he was referred to urology  Reports he is not taking Micardis, wonders if this was previously held by previous cardiologist  Not on anticoagulation, episode paroxysmal A. fib February 2016  EKG personally reviewed by myself on todays visit Shows normal sinus rhythm rate 80 bpm T wave abnormality anterior precordial leads, consider old anterior MI, APCs  Previous testing reviewed with him in detail Echo 11/16/16 Left ventricle: The cavity size was normal. Wall thickness was   normal. Systolic function was normal. The estimated ejection   fraction was in the range of 50% to 55%. Left ventricular   diastolic function parameters were  normal for the patient&'s age.  ETT 11/16/2016 Normal exercise stress test Good exercise tolerance  Total chol 133, LDL 56  Other past medical history reviewed He denies any  Recent symptoms concerning for paroxysmal atrial fibrillation When atrial fib first happened, did not feel it, "but now kind of recognize it, but could sleep through it"   previously on anticoagulation, eliquis but was unable to afford this long-term Denied any bleeding complications CHADS VASC of 3 Even if he was able to afford the medication, he is not sure if he would want to be on it Not particular interested in warfarin His father is on anticoagulation  Elevated PSA of 7.5 in February 2018 significantly increased from prior PSA of 4.75 in June 2015. lipid profile without statin, LDL 56, HDL 39 and total cholesterol 133. Echocardiogram May 2017 shows an EF of 40%. By echo    PMH:   has a past medical history of A-fib [facility]), Coronary artery disease (03/25/2014), Hyperlipidemia, Hypertension, NSTEMI (non-ST elevated myocardial infarction) (HCC), and Systolic dysfunction with heart failure (HCC).  PSH:    Past Surgical History:  Procedure Laterality Date  . CARDIAC CATHETERIZATION  2015  . CORONARY ARTERY BYPASS GRAFT  02/2014   [facility]    Current Outpatient Medications  Medication Sig Dispense Refill  . sildenafil (REVATIO) 20 MG tablet Take as directed    . amLODipine (NORVASC) 5 MG tablet Take 1 tablet (5 mg total) daily by mouth. 90 tablet 3  . aspirin EC 81 MG tablet Take 81 mg by mouth daily.     Marland Kitchen atorvastatin (LIPITOR) 10 MG tablet Take  1 tablet (10 mg total) daily by mouth. 90 tablet 3  . diltiazem (CARDIZEM CD) 120 MG 24 hr capsule Take 1 capsule (120 mg total) daily by mouth. 90 capsule 3  . metoprolol tartrate (LOPRESSOR) 100 MG tablet Take 1 tablet (100 mg total) 2 (two) times daily by mouth. 180 tablet 3  . telmisartan (MICARDIS) 40 MG tablet TAKE ONE (1) TABLET EACH DAY 90 tablet 0   No  current facility-administered medications for this visit.      Allergies:   Codeine   Social History:  The patient  reports that he quit smoking about 3 years ago. His smoking use included cigarettes. He has a 40.00 pack-year smoking history. he has never used smokeless tobacco. He reports that he drinks alcohol. He reports that he does not use drugs.   Family History:   family history includes Diabetes gravidarum in his brother and father; Heart attack (age of onset: 7159) in his father; Heart disease in his father.    Review of Systems: Review of Systems  Constitutional: Negative.   Respiratory: Negative.   Cardiovascular: Negative.   Gastrointestinal: Negative.   Musculoskeletal: Negative.   Neurological: Negative.   Psychiatric/Behavioral: Negative.   All other systems reviewed and are negative.    PHYSICAL EXAM: VS:  BP 140/80 (BP Location: Left Arm, Patient Position: Sitting, Cuff Size: Normal)   Pulse 80   Ht 5\' 9"  (1.753 m)   Wt 191 lb 12 oz (87 kg)   BMI 28.32 kg/m  , BMI Body mass index is 28.32 kg/m.  No significant change in physical exam compared to previous office visits GEN: Well nourished, well developed, in no acute distress  HEENT: normal  Neck: no JVD, carotid bruits, or masses Cardiac: RRR; no murmurs, rubs, or gallops,no edema  Respiratory:  clear to auscultation bilaterally, normal work of breathing GI: soft, nontender, nondistended, + BS MS: no deformity or atrophy  Skin: warm and dry, no rash Neuro:  Strength and sensation are intact Psych: euthymic mood, full affect    Recent Labs: No results found for requested labs within last 8760 hours.    Lipid Panel No results found for: CHOL, HDL, LDLCALC, TRIG    Wt Readings from Last 3 Encounters:  08/13/17 191 lb 12 oz (87 kg)  10/16/16 190 lb 8 oz (86.4 kg)       ASSESSMENT AND PLAN:  Acute on chronic combined systolic and diastolic CHF (congestive heart failure) (HCC) -  Appears  euvolemic on today's visit Does not appear to be taking his angiotensin receptor blocker Stop this on his own, does not want to restart Reports blood pressure stable and feels fine  Hypertension, unspecified type Previously discussed that he is on 2 calcium channel blockers, he prefers to keep his medications as they are as he feels well   Encounter for examination required by Department of Transportation (DOT) - Previous echocardiogram and stress tests unrevealing, ejection fraction borderline low 50%  Coronary artery disease of bypass graft of native heart with stable angina pectoris (HCC) Currently with no symptoms of angina. No further workup at this time. Continue current medication regimen.  Mixed hyperlipidemia Lipitor 10 started on previous office visit Goal LDL less than 70 or lower  Smoking history Stop smoking several years ago, 2015   Paroxysmal atrial fibrillation Denies any palpitations or symptoms concerning for arrhythmia CHADS VASC of 3 He has declined anticoagulation  Aware of risk of stroke   Disposition:   F/U  12 months   Total encounter time more than 25 minutes  Greater than 50% was spent in counseling and coordination of care with the patient    No orders of the defined types were placed in this encounter.    Signed, Dossie Arbour, M.D., Ph.D. 08/13/2017  [facility] [place], [place] [phone]

## 2017-08-13 ENCOUNTER — Ambulatory Visit: Payer: Self-pay | Admitting: Cardiovascular Disease

## 2017-08-13 ENCOUNTER — Encounter: Payer: Self-pay | Admitting: Cardiovascular Disease

## 2017-08-13 VITALS — BP 140/80 | HR 80 | Ht 69.0 in | Wt 191.8 lb

## 2017-08-13 DIAGNOSIS — E782 Mixed hyperlipidemia: Secondary | ICD-10-CM

## 2017-08-13 DIAGNOSIS — I5043 Acute on chronic combined systolic (congestive) and diastolic (congestive) heart failure: Secondary | ICD-10-CM

## 2017-08-13 DIAGNOSIS — I10 Essential (primary) hypertension: Secondary | ICD-10-CM

## 2017-08-13 DIAGNOSIS — I25708 Atherosclerosis of coronary artery bypass graft(s), unspecified, with other forms of angina pectoris: Secondary | ICD-10-CM

## 2017-08-13 DIAGNOSIS — Z87891 Personal history of nicotine dependence: Secondary | ICD-10-CM

## 2017-08-13 MED ORDER — SILDENAFIL CITRATE 20 MG PO TABS: ORAL_TABLET | ORAL | 0 refills | Status: DC

## 2017-08-13 NOTE — Patient Instructions (Addendum)
Primary care [phone] For primary care  [place] [facility]: [facility]    Please monitor your blood perssure  Medication Instructions:   No medication changes made  Labwork:  No new labs needed  Testing/Procedures:  No further testing at this time   Follow-Up: It was a pleasure seeing you in the office today. Please call us if you have new issues that need to be addressed before your next appt.  [phone]  Your physician wants you to follow-up in: 12 months.  You will receive a reminder letter in the mail two months in advance. If you don't receive a letter, please call our office to schedule the follow-up appointment.  If you need a refill on your cardiac medications before your next appointment, please call your pharmacy.

## 2017-09-15 ENCOUNTER — Other Ambulatory Visit: Payer: Self-pay | Admitting: Cardiovascular Disease

## 2017-09-17 ENCOUNTER — Other Ambulatory Visit: Payer: Self-pay | Admitting: *Deleted

## 2017-09-17 ENCOUNTER — Telehealth: Payer: Self-pay | Admitting: Cardiovascular Disease

## 2017-09-17 MED ORDER — METOPROLOL TARTRATE 100 MG PO TABS: 100.0000 mg | ORAL_TABLET | Freq: Two times a day (BID) | ORAL | 3 refills | Status: DC

## 2017-09-17 MED ORDER — AMLODIPINE BESYLATE 5 MG PO TABS: 5.0000 mg | ORAL_TABLET | Freq: Every day | ORAL | 3 refills | Status: DC

## 2017-09-17 MED ORDER — TELMISARTAN 40 MG PO TABS: ORAL_TABLET | ORAL | 3 refills | Status: DC

## 2017-09-17 MED ORDER — ATORVASTATIN CALCIUM 10 MG PO TABS: 10.0000 mg | ORAL_TABLET | Freq: Every day | ORAL | 3 refills | Status: DC

## 2017-09-17 NOTE — Telephone Encounter (Signed)
Requested Prescriptions   Signed Prescriptions Disp Refills  . amLODipine (NORVASC) 5 MG tablet 90 tablet 3    Sig: Take 1 tablet (5 mg total) by mouth daily.    Authorizing Provider: GOLLAN, TIMOTHY J    Ordering User: Baili Stang C  . atorvastatin (LIPITOR) 10 MG tablet 90 tablet 3    Sig: Take 1 tablet (10 mg total) by mouth daily.    Authorizing Provider: GOLLAN, TIMOTHY J    Ordering User: Mohannad Olivero C  . metoprolol tartrate (LOPRESSOR) 100 MG tablet 180 tablet 3    Sig: Take 1 tablet (100 mg total) by mouth 2 (two) times daily.    Authorizing Provider: GOLLAN, TIMOTHY J    Ordering User: Simisola Sandles C  . telmisartan (MICARDIS) 40 MG tablet 90 tablet 3    Sig: TAKE ONE (1) TABLET EACH DAY    Authorizing Provider: GOLLAN, TIMOTHY J    Ordering User: Jilliann Subramanian C    

## 2017-09-17 NOTE — Telephone Encounter (Signed)
Requested Prescriptions   Signed Prescriptions Disp Refills  . amLODipine (NORVASC) 5 MG tablet 90 tablet 3    Sig: Take 1 tablet (5 mg total) by mouth daily.    Authorizing Provider: Antonieta IbaGOLLAN, TIMOTHY J    Ordering User: Shawnie DapperLOPEZ, MARINA C  . atorvastatin (LIPITOR) 10 MG tablet 90 tablet 3    Sig: Take 1 tablet (10 mg total) by mouth daily.    Authorizing Provider: Antonieta IbaGOLLAN, TIMOTHY J    Ordering User: Shawnie DapperLOPEZ, MARINA C  . metoprolol tartrate (LOPRESSOR) 100 MG tablet 180 tablet 3    Sig: Take 1 tablet (100 mg total) by mouth 2 (two) times daily.    Authorizing Provider: Antonieta IbaGOLLAN, TIMOTHY J    Ordering User: Shawnie DapperLOPEZ, MARINA C  . telmisartan (MICARDIS) 40 MG tablet 90 tablet 3    Sig: TAKE ONE (1) TABLET EACH DAY    Authorizing Provider: Antonieta IbaGOLLAN, TIMOTHY J    Ordering User: Kendrick FriesLOPEZ, MARINA C

## 2017-09-17 NOTE — Telephone Encounter (Signed)
°*  STAT* If patient is at the pharmacy, call can be transferred to refill team.   1. Which medications need to be refilled? (please list name of each medication and dose if known)  Amlodipine  Atorvastatin Metoprolol   Telmisartan    2. Which pharmacy/location (including street and city if local pharmacy) is medication to be sent to? [facility]   3. Do they need a 30 day or 90 day supply? 90 day

## 2018-03-30 ENCOUNTER — Other Ambulatory Visit: Payer: Self-pay | Admitting: Cardiovascular Disease

## 2018-07-08 ENCOUNTER — Other Ambulatory Visit: Payer: Self-pay | Admitting: Cardiovascular Disease

## 2018-07-08 DIAGNOSIS — I10 Essential (primary) hypertension: Secondary | ICD-10-CM

## 2018-07-08 DIAGNOSIS — I5043 Acute on chronic combined systolic (congestive) and diastolic (congestive) heart failure: Secondary | ICD-10-CM

## 2018-07-08 DIAGNOSIS — Z87891 Personal history of nicotine dependence: Secondary | ICD-10-CM

## 2018-07-08 DIAGNOSIS — I25708 Atherosclerosis of coronary artery bypass graft(s), unspecified, with other forms of angina pectoris: Secondary | ICD-10-CM

## 2018-07-08 DIAGNOSIS — E782 Mixed hyperlipidemia: Secondary | ICD-10-CM

## 2018-09-22 NOTE — Progress Notes (Signed)
Cardiology Office Note  Date:  09/23/2018   ID:  James [name], James [name] [birth year], MRN [id number]  PCP:  James Shames, MD   Chief Complaint  Patient presents with  . other    12 mo follow up.Denies CP, SOB and dizziness.Medications reviewed with patient. :Doing well"    HPI:  James [name] is a  64 year old gentleman with history of  hypertension,  paroxysmal atrial fibrillation in February 2016, previously declined anticoagulation smoker 40 years,  Quit  2015 experienced substernal chest discomfort 03/20/14 and presented to [facility] Emergency Room,   EKG revealed ST elevations in the anterior leads consistent with ST elevation myocardial infarction,    Cardiac catheterization was performed,  high-grade stenosis in the distal left main and ostium of the left anterior descending coronary artery with TIMI-3 flow.   CABG at [facility].  March 25, 2014 He presents for Follow-up of his coronary artery disease  Previously required DOT clearance 2018  No sx No insurance Denies having any significant chest pain or shortness of breath No regular exercise Does not have primary care release has not seen him in 2 years No recent lab work in 2 years Prefers not to have lab work done as he has no insurance but will try to establish with a local clinic [facility] or [facility] until [facility] kicks in Denies any palpitations or tachycardia concerning for arrhythmia  Reports he has all of his medications, uses good [email or web address]  Not on anticoagulation, Episode atrial fibrillation 2016  Long discussion concerning DOT physical Check the parameters, he needs   EKG personally reviewed by myself on todays visit Shows normal sinus rhythm with rate 71 bpm old anterior MI, T wave abnormality precordial leads   Other past medical history reviewed Was declining colonoscopy, Hemoccult testing performed PSA 7.04 September 2016 with significant increase from June 2015, he was referred to  urology  Echo 11/16/16 Left ventricle: The cavity size was normal. Wall thickness was   normal. Systolic function was normal. The estimated ejection   fraction was in the range of 50% to 55%. Left ventricular   diastolic function parameters were normal for the patient&'s age.  ETT 11/16/2016 Normal exercise stress test Good exercise tolerance  Total chol 133, LDL 56   previously on anticoagulation, eliquis but was unable to afford this long-term Denied any bleeding complications CHADS VASC of 3 Even if he was able to afford the medication, he is not sure if he would want to be on it Not particular interested in warfarin His father is on anticoagulation  Elevated PSA of 7.5 in February 2018 significantly increased from prior PSA of 4.75 in June 2015. lipid profile without statin, LDL 56, HDL 39 and total cholesterol 133. Echocardiogram May 2017 shows an EF of 40%. By echo    PMH:   has a past medical history of A-fib [facility]), Coronary artery disease (03/25/2014), Hyperlipidemia, Hypertension, NSTEMI (non-ST elevated myocardial infarction) (HCC), and Systolic dysfunction with heart failure (HCC).  PSH:    Past Surgical History:  Procedure Laterality Date  . CARDIAC CATHETERIZATION  2015  . CORONARY ARTERY BYPASS GRAFT  02/2014   [facility]    Current Outpatient Medications  Medication Sig Dispense Refill  . amLODipine (NORVASC) 5 MG tablet Take 1 tablet (5 mg total) by mouth daily. 90 tablet 3  . aspirin EC 81 MG tablet Take 81 mg by mouth daily.     Marland Kitchen atorvastatin (LIPITOR) 10 MG tablet Take 1 tablet (10  mg total) by mouth daily. 90 tablet 3  . metoprolol tartrate (LOPRESSOR) 100 MG tablet Take 1 tablet (100 mg total) by mouth 2 (two) times daily. 180 tablet 3  . sildenafil (REVATIO) 20 MG tablet TAKE AS DIRECTED BY DOCTOR 30 tablet 0  . telmisartan (MICARDIS) 40 MG tablet TAKE ONE (1) TABLET EACH DAY 90 tablet 3   No current facility-administered medications for this visit.       Allergies:   Codeine   Social History:  The patient  reports that he quit smoking about 4 years ago. His smoking use included cigarettes. He has a 40.00 pack-year smoking history. He has never used smokeless tobacco. He reports current alcohol use. He reports that he does not use drugs.   Family History:   family history includes Diabetes gravidarum in his brother and father; Heart attack (age of onset: 42) in his father; Heart disease in his father.    Review of Systems: Review of Systems  Constitutional: Negative.   Respiratory: Negative.   Cardiovascular: Negative.   Gastrointestinal: Negative.   Musculoskeletal: Negative.   Neurological: Negative.   Psychiatric/Behavioral: Negative.   All other systems reviewed and are negative.   PHYSICAL EXAM: VS:  BP (!) 148/84 (BP Location: Left Arm, Patient Position: Sitting, Cuff Size: Normal)   Pulse 71  , BMI There is no height or weight on file to calculate BMI.  Constitutional:  oriented to person, place, and time. No distress.  HENT:  Head: Grossly normal Eyes:  no discharge. No scleral icterus.  Neck: No JVD, no carotid bruits  Cardiovascular: Regular rate and rhythm, no murmurs appreciated Pulmonary/Chest: Clear to auscultation bilaterally, no wheezes or rails Abdominal: Soft.  no distension.  no tenderness.  Musculoskeletal: Normal range of motion Neurological:  normal muscle tone. Coordination normal. No atrophy Skin: Skin warm and dry Psychiatric: normal affect, pleasant   Recent Labs: No results found for requested labs within last 8760 hours.    Lipid Panel No results found for: CHOL, HDL, LDLCALC, TRIG    Wt Readings from Last 3 Encounters:  08/13/17 191 lb 12 oz (87 kg)  10/16/16 190 lb 8 oz (86.4 kg)       ASSESSMENT AND PLAN:  Acute on chronic combined systolic and diastolic CHF (congestive heart failure) (HCC) -  Appears euvolemic, reports he is compliant with his medications Long discussion  concerning various clinics that he could sign up with as he has no insurance  Hypertension, unspecified type Reports better blood pressure at home in the 120 range systolic  No changes to his medications  Encounter for examination required by Department of Transportation (DOT) - Previous echocardiogram 50% He will need routine treadmill test for clearance recommended on an annual basis  Coronary artery disease of bypass graft of native heart with stable angina pectoris (HCC) Denies any angina, Treadmill study ordered for DOT physical clearance in 1 month  Mixed hyperlipidemia Ideally would like to do lab work, he reports no insurance and prefers to wait at this time He will try to set up care with [place] [facility] or [facility] to have lab work done there  Smoking history Stopped smoking several years ago, 2015   Paroxysmal atrial fibrillation Denies any palpitations or symptoms concerning for arrhythmia CHADS VASC of 3 Declining anticoagulation and on previous visits, aware of stroke risk   Disposition:   F/U  12 months   Total encounter time more than 25 minutes  Greater than 50% was spent  in counseling and coordination of care with the patient    Orders Placed This Encounter  Procedures  . EKG 12-Lead     Signed, Dossie Arbour, M.D., Ph.D. 09/23/2018  [facility] [place], [place] [phone]

## 2018-09-23 ENCOUNTER — Encounter: Payer: Self-pay | Admitting: Cardiovascular Disease

## 2018-09-23 ENCOUNTER — Ambulatory Visit: Payer: Self-pay | Admitting: Cardiovascular Disease

## 2018-09-23 VITALS — BP 148/84 | HR 71

## 2018-09-23 DIAGNOSIS — I25708 Atherosclerosis of coronary artery bypass graft(s), unspecified, with other forms of angina pectoris: Secondary | ICD-10-CM

## 2018-09-23 DIAGNOSIS — E782 Mixed hyperlipidemia: Secondary | ICD-10-CM

## 2018-09-23 DIAGNOSIS — I5043 Acute on chronic combined systolic (congestive) and diastolic (congestive) heart failure: Secondary | ICD-10-CM

## 2018-09-23 DIAGNOSIS — I10 Essential (primary) hypertension: Secondary | ICD-10-CM

## 2018-09-23 DIAGNOSIS — Z87891 Personal history of nicotine dependence: Secondary | ICD-10-CM

## 2018-09-23 DIAGNOSIS — Z0289 Encounter for other administrative examinations: Secondary | ICD-10-CM

## 2018-09-23 DIAGNOSIS — I480 Paroxysmal atrial fibrillation: Secondary | ICD-10-CM

## 2018-09-23 MED ORDER — ATORVASTATIN CALCIUM 10 MG PO TABS: 10.0000 mg | ORAL_TABLET | Freq: Every day | ORAL | 3 refills | Status: DC

## 2018-09-23 MED ORDER — AMLODIPINE BESYLATE 5 MG PO TABS: 5.0000 mg | ORAL_TABLET | Freq: Every day | ORAL | 3 refills | Status: DC

## 2018-09-23 MED ORDER — TELMISARTAN 40 MG PO TABS: ORAL_TABLET | ORAL | 3 refills | Status: DC

## 2018-09-23 MED ORDER — METOPROLOL TARTRATE 100 MG PO TABS: 100.0000 mg | ORAL_TABLET | Freq: Two times a day (BID) | ORAL | 3 refills | Status: DC

## 2018-09-23 NOTE — Patient Instructions (Addendum)
Medical management paperwork, for meds [facility], Leonette Most drew  DOT requirements printed  Medication Instructions:  No changes  If you need a refill on your cardiac medications before your next appointment, please call your pharmacy.    Lab work: No new labs needed   If you have labs (blood work) drawn today and your tests are completely normal, you will receive your results only by: Marland Kitchen MyChart Message (if you have MyChart) OR . A paper copy in the mail If you have any lab test that is abnormal or we need to change your treatment, we will call you to review the results.   Testing/Procedures: Your physician has requested that you have an exercise tolerance test. For further information please visit [email or web address] Please also follow instruction sheet, as given.    Do not drink or eat foods with caffeine for 24 hours before the test. (Chocolate, coffee, tea, or energy drinks)  If you use an inhaler, bring it with you to the test.  Do not smoke for 4 hours before the test.  Wear comfortable shoes and clothing.  Hold your metoprolol the night before and morning of your test.    Follow-Up: At [facility], you and your health needs are our priority.  As part of our continuing mission to provide you with exceptional heart care, we have created designated Provider Care Teams.  These Care Teams include your primary Cardiologist (physician) and Advanced Practice Providers (APPs -  Physician Assistants and Nurse Practitioners) who all work together to provide you with the care you need, when you need it.  . You will need a follow up appointment in 12 months .   Please call our office 2 months in advance to schedule this appointment.    . Providers on your designated Care Team:   . Nicolasa Ducking, NP . Eula Listen, PA-C . Marisue Ivan, PA-C  Any Other Special Instructions Will Be Listed Below (If Applicable).  For educational health videos Log in to :  [email or web address] Or : [email or web address], password : [email or web address]  Exercise Stress Test An exercise stress test is a test to check how your heart works during exercise. You will need to walk on a treadmill or ride an exercise bike for this test. An electrocardiogram (ECG) will record your heartbeat when you are at rest and when you are exercising. You may have an ultrasound or nuclear test after the exercise test. The test is done to check for coronary artery disease (CAD). It is also done to:  See how well you can exercise.  Watch for high blood pressure during exercise.  Test how well you can exercise after treatment.  Check the blood flow to your arms and legs. If your test result is not normal, more testing may be needed. What happens before the procedure?  Follow instructions from your doctor about what you cannot eat or drink. ? Do not have any drinks or foods that have caffeine in them for 24 hours before the test, or as told by your doctor. This includes coffee, tea (even decaf tea), sodas, chocolate, and cocoa.  Ask your doctor about changing or stopping your normal medicines. This is important if you: ? Take diabetes medicines. ? Take beta-blocker medicines. ? Wear a nitroglycerin patch.  If you use an inhaler, bring it with you to the test.  Do not put lotions, powders, creams, or oils on your chest before the test.  Wear comfortable shoes and clothing.  Do not use any products  that have nicotine or tobacco in them, such as cigarettes and e-cigarettes. Stop using them at least 4 hours before the test. If you need help quitting, ask your doctor. What happens during the procedure?  Patches (electrodes) will be put on your chest.  Wires will be connected to the patches. The wires will send signals to a machine to record your heartbeat.  Your heart rate will be watched while you are resting and while you are exercising. Your blood pressure will also be watched during the test.  You  will walk on a treadmill or use a stationary bike. If you cannot use these, you may be asked to turn a crank with your hands.  The activity will get harder and will raise your heart rate.  You may be asked to breathe into a tube a few times during the test. This measures the gases that you breathe out.  You will be asked how you are feeling throughout the test.  You will exercise until your heart reaches a target heart rate. You will stop early if: ? You feel dizzy. ? You have chest pain. ? You are out of breath. ? Your blood pressure is too high or too low. ? You have an irregular heartbeat. ? You have pain or aching in your arms or legs. The procedure may vary among doctors and hospitals. What happens after the procedure?  Your blood pressure, heart rate, breathing rate, and blood oxygen level will be watched after the test.  You may return to your normal diet and activities as told by your doctor.  It is up to you to get the results of your test. Ask your doctor, or the department that is doing the test, when your results will be ready. Summary  An exercise stress test is a test to check how your heart works during exercise.  This test is done to check for coronary artery disease.  Your heart rate will be watched while you are resting and while you are exercising.  Follow instructions from your doctor about what you cannot eat or drink before the test. This information is not intended to replace advice given to you by your health care provider. Make sure you discuss any questions you have with your health care provider. Document Released: 01/03/2008 Document Revised: 10/17/2016 Document Reviewed: 10/17/2016 Elsevier Interactive Patient Education  2019 [employer].

## 2019-05-19 ENCOUNTER — Telehealth: Payer: Self-pay | Admitting: Cardiovascular Disease

## 2019-05-19 DIAGNOSIS — I10 Essential (primary) hypertension: Secondary | ICD-10-CM

## 2019-05-19 DIAGNOSIS — Z87891 Personal history of nicotine dependence: Secondary | ICD-10-CM

## 2019-05-19 DIAGNOSIS — I25708 Atherosclerosis of coronary artery bypass graft(s), unspecified, with other forms of angina pectoris: Secondary | ICD-10-CM

## 2019-05-19 DIAGNOSIS — E782 Mixed hyperlipidemia: Secondary | ICD-10-CM

## 2019-05-19 DIAGNOSIS — I5043 Acute on chronic combined systolic (congestive) and diastolic (congestive) heart failure: Secondary | ICD-10-CM

## 2019-05-19 NOTE — Telephone Encounter (Signed)
°*  STAT* If patient is at the pharmacy, call can be transferred to refill team.   1. Which medications need to be refilled? (please list name of each medication and dose if known)    Sildenafil 20 mg as directed   2. Which pharmacy/location (including street and city if local pharmacy) is medication to be sent to?   [facility] [address]   3. Do they need a 30 day or 90 day supply? [place]

## 2019-05-20 NOTE — Telephone Encounter (Signed)
Please review for refill on Sildenafil 20 mg.

## 2019-05-20 NOTE — Telephone Encounter (Signed)
Please clarify dosage and frequency? Routing to Dr Rockey Situ.

## 2019-05-21 NOTE — Telephone Encounter (Signed)
Sildenafil 20 mg 3 times daily as needed #90 0 refills

## 2019-05-22 MED ORDER — SILDENAFIL CITRATE 20 MG PO TABS: ORAL_TABLET | ORAL | 0 refills | Status: DC

## 2019-10-27 NOTE — Progress Notes (Addendum)
Office Visit    Patient Name: James [name] Date of Encounter: 10/28/2019  Primary Care Provider:  Glendon Axe, MD Primary Cardiologist:  Ida Rogue, MD Electrophysiologist:  None   Chief Complaint    James [name] is a 64 y.o. male with a hx of HTN, PAF, previous tobacco abuse, CAD s/p CABG, bifasicular heart block presents today for follow-up of CAD  Past Medical History    Past Medical History:  Diagnosis Date  . A-fib ([place])   . Coronary artery disease 03/25/2014   CABG w/single artery graft @ [facility]   . Hyperlipidemia   . Hypertension   . NSTEMI (non-ST elevated myocardial infarction) ([place])   . Systolic dysfunction with heart failure [facility])    Past Surgical History:  Procedure Laterality Date  . CARDIAC CATHETERIZATION  2015  . CORONARY ARTERY BYPASS GRAFT  02/2014   [facility]    Allergies  Allergies  Allergen Reactions  . Codeine Other (See Comments)    Chest pain    History of Present Illness    James [name] is a 64 y.o. male with a hx of HTN, PAF, previous tobacco abuse, CAD s/p CABG last seen 09/23/2018 by Dr. Rockey Situ.  Previous smoker of 40 years having quit in 2015.  He had remote instance of PAF in February 2016 and has declined anticoagulation.  His CAD is s/p CABG at [facility] August 2015.  Echo 2017 with LVEF 40%.  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  Echo April 2018 with LVEF 50-55% and no significant valvular disease.  Enjoys fishing in his spare time. Tells me he walks daily for 25 to 30 minutes for exercise without difficulty.  Reports no shortness of breath, dyspnea on exertion.  Reports no chest pain, pressure, tightness.  Tells me his blood pressure is up and down all the time. His BP yesterday was 125/79. Tells me his BP is mid 893Y systolic most of the time.   Does not have primary care provider.  Tells me he plans to establish with a provider that his brother sees  as he wants to establish before he turns 83 in August. EKGs/Labs/Other Studies Reviewed:   The following studies were reviewed today: Echo 11/16/16 Left ventricle: The cavity size was normal. Wall thickness was   normal. Systolic function was normal. The estimated ejection   fraction was in the range of 50% to 55%. Left ventricular   diastolic function parameters were normal for the patient&'s age.   ETT 11/16/2016 Normal exercise stress test Good exercise tolerance  EKG:  EKG is ordered today.  The ekg ordered today demonstrates SR 64 bpm with known bifasicular heart block and stable TWI to lateral leads.  Recent Labs: No results found for requested labs within last 8760 hours.  Recent Lipid Panel No results found for: CHOL, TRIG, HDL, CHOLHDL, VLDL, LDLCALC, LDLDIRECT  Home Medications   Current Meds  Medication Sig  . amLODipine (NORVASC) 5 MG tablet Take 1 tablet (5 mg total) by mouth daily.  Marland Kitchen aspirin EC 81 MG tablet Take 81 mg by mouth daily.   Marland Kitchen atorvastatin (LIPITOR) 10 MG tablet Take 1 tablet (10 mg total) by mouth daily.  . metoprolol tartrate (LOPRESSOR) 100 MG tablet Take 1 tablet (100 mg total) by mouth 2 (two) times daily.  . sildenafil (REVATIO) 20 MG tablet TAKE AS DIRECTED BY DOCTOR  . telmisartan (MICARDIS)  40 MG tablet TAKE ONE (1) TABLET EACH DAY    Review of Systems   Review of Systems  Constitution: Negative for chills, fever and malaise/fatigue.  Cardiovascular: Negative for chest pain, dyspnea on exertion, leg swelling, near-syncope, orthopnea, palpitations and syncope.  Respiratory: Negative for cough, shortness of breath and wheezing.   Gastrointestinal: Negative for nausea and vomiting.  Neurological: Negative for dizziness, light-headedness and weakness.   All other systems reviewed and are otherwise negative except as noted above.  Physical Exam    VS:  BP (!) 164/90 (BP Location: Left Arm, Patient Position: Sitting, Cuff Size: Normal)   Pulse  70   Ht 5\' 9"  (1.753 m)   Wt 187 lb (84.8 kg)   SpO2 98%   BMI 27.62 kg/m  , BMI Body mass index is 27.62 kg/m. GEN: Well nourished, well developed, in no acute distress. HEENT: normal. Neck: Supple, no JVD, carotid bruits, or masses. Cardiac: RRR, no murmurs, rubs, or gallops. No clubbing, cyanosis, edema.  Radials/DP/PT 2+ and equal bilaterally.  Respiratory:  Respirations regular and unlabored, clear to auscultation bilaterally. GI: Soft, nontender, nondistended, BS + x 4. MS: No deformity or atrophy. Skin: Warm and dry, no rash. Neuro:  Strength and sensation are intact. Psych: Normal affect.  Accessory Clinical Findings    ECG personally reviewed by me today - NSR 64 bpm with known bifasicular block (RBBB, LAFB) and stable TWI to lateral leads - no acute changes.  Assessment & Plan    1. CAD s/p CABG 2015 -stable with no anginal symptoms.  No indication for ischemic evaluation this time.  Continue GDMT aspirin, beta-blocker, statin.  2. PAF -Remote instance 2016.  No evidence of recurrence.  Has previously declined anticoagulation.  Continues to decline anticoagulation due to cost and feeling that he does not need it.  CHA2DS2-VASc of at least 3.  We reviewed risks and benefits of remaining off anticoagulation and he verbalized understanding.  3. Chronic systolic heart failure -echo 2017 with LVEF 40%.  Repeat echo 2018 with LVEF 50-55%.  Euvolemic and well compensated on exam.Continue GDMT beta-blocker and ARB.  No indication for diuretic at this time.  Recommend low-sodium, heart healthy diet.  4. HTN -blood pressure elevated today but reports it is well controlled at home.  We will continue his present antihypertensive regimen.  He was encouraged to report blood pressure consistently greater than 130/80.  We reviewed the blood pressure is most often labile throughout the day and he should wait 10 minutes resting before checking.  5. HLD, LDL goal less than 70 -continue  atorvastatin.  He declines lipid panel today.  6. ED - Refill of sildenafil provided.  7. Bifasicular heart block -stable finding and EKG.  Reports no lightheadedness, dizziness, near syncope.  He was educated to report the symptoms.  Of note, patient declines all lab work today.  We reviewed the importance of monitoring lipid panel as well as kidney and liver function while on medication.  He continues to decline.  He will call our office if he is agreeable to have lab work done and can [facility] mall.  Disposition: Follow up in 1 year(s) with Dr. 2019 or APP.  Mariah Milling, NP 10/28/2019, 9:10 AM

## 2019-10-28 ENCOUNTER — Encounter: Payer: Self-pay | Admitting: Family

## 2019-10-28 ENCOUNTER — Ambulatory Visit (INDEPENDENT_AMBULATORY_CARE_PROVIDER_SITE_OTHER): Payer: Managed Care, Other (non HMO) | Admitting: Family

## 2019-10-28 ENCOUNTER — Other Ambulatory Visit: Payer: Self-pay

## 2019-10-28 VITALS — BP 164/90 | HR 70 | Ht 69.0 in | Wt 187.0 lb

## 2019-10-28 DIAGNOSIS — I5043 Acute on chronic combined systolic (congestive) and diastolic (congestive) heart failure: Secondary | ICD-10-CM | POA: Diagnosis not present

## 2019-10-28 DIAGNOSIS — I10 Essential (primary) hypertension: Secondary | ICD-10-CM | POA: Diagnosis not present

## 2019-10-28 DIAGNOSIS — I25708 Atherosclerosis of coronary artery bypass graft(s), unspecified, with other forms of angina pectoris: Secondary | ICD-10-CM

## 2019-10-28 DIAGNOSIS — E782 Mixed hyperlipidemia: Secondary | ICD-10-CM

## 2019-10-28 DIAGNOSIS — Z87891 Personal history of nicotine dependence: Secondary | ICD-10-CM

## 2019-10-28 DIAGNOSIS — N529 Male erectile dysfunction, unspecified: Secondary | ICD-10-CM | POA: Diagnosis not present

## 2019-10-28 MED ORDER — ATORVASTATIN CALCIUM 10 MG PO TABS: 10.0000 mg | ORAL_TABLET | Freq: Every day | ORAL | 3 refills | Status: DC

## 2019-10-28 MED ORDER — TELMISARTAN 40 MG PO TABS: ORAL_TABLET | ORAL | 3 refills | Status: DC

## 2019-10-28 MED ORDER — AMLODIPINE BESYLATE 5 MG PO TABS: 5.0000 mg | ORAL_TABLET | Freq: Every day | ORAL | 3 refills | Status: DC

## 2019-10-28 MED ORDER — SILDENAFIL CITRATE 20 MG PO TABS: ORAL_TABLET | ORAL | 0 refills | Status: DC

## 2019-10-28 MED ORDER — METOPROLOL TARTRATE 100 MG PO TABS: 100.0000 mg | ORAL_TABLET | Freq: Two times a day (BID) | ORAL | 3 refills | Status: DC

## 2019-10-28 NOTE — Patient Instructions (Signed)
Medication Instructions:  No medication changes today.  Refills were sent to [facility].  *If you need a refill on your cardiac medications before your next appointment, please call your pharmacy*   Lab Work: We typically recommend lab work once per year. If you decide you wish to have your lab work collected, please call our office and let us know.   Testing/Procedures: Your EKG today shows normal sinus rhythm. You have a known bifasicular block which is a stable finding for you. We would want you to report new lightheadedness, dizziness, or episodes of almost passing out.   Follow-Up: At [facility], you and your health needs are our priority.  As part of our continuing mission to provide you with exceptional heart care, we have created designated Provider Care Teams.  These Care Teams include your primary Cardiologist (physician) and Advanced Practice Providers (APPs -  Physician Assistants and Nurse Practitioners) who all work together to provide you with the care you need, when you need it.  We recommend signing up for the patient portal called "MyChart".  Sign up information is provided on this After Visit Summary.  MyChart is used to connect with patients for Virtual Visits (Telemedicine).  Patients are able to view lab/test results, encounter notes, upcoming appointments, etc.  Non-urgent messages can be sent to your provider as well.   To learn more about what you can do with MyChart, go to [email or web address].    Your next appointment:   1 year(s)  The format for your next appointment:   In Person  Provider:   You may see Julien Nordmann, MD or one of the following Advanced Practice Providers on your designated Care Team:    Nicolasa Ducking, NP  Eula Listen, PA-C  Marisue Ivan, PA-C

## 2020-06-16 ENCOUNTER — Other Ambulatory Visit: Payer: Self-pay | Admitting: Cardiovascular Disease

## 2020-06-16 DIAGNOSIS — N529 Male erectile dysfunction, unspecified: Secondary | ICD-10-CM

## 2020-06-16 MED ORDER — SILDENAFIL CITRATE 20 MG PO TABS: ORAL_TABLET | ORAL | 0 refills | Status: DC

## 2020-06-16 NOTE — Telephone Encounter (Signed)
Requested Prescriptions   Signed Prescriptions Disp Refills   sildenafil (REVATIO) 20 MG tablet 90 tablet 0    Sig: 1 tablet three times daily as needed as directed per doctor    Authorizing Provider: Antonieta Iba    Ordering User: Thayer Headings, Valencia Kassa L

## 2020-06-16 NOTE — Telephone Encounter (Signed)
°*  STAT* If patient is at the pharmacy, call can be transferred to refill team.   1. Which medications need to be refilled? (please list name of each medication and dose if known) sildenafil 20 MG  2. Which pharmacy/location (including street and city if local pharmacy) is medication to be sent to? [facility] near [address]  3. Do they need a 30 day or 90 day supply? 90 day   Patient only has 3 left

## 2020-07-26 ENCOUNTER — Telehealth: Payer: Self-pay | Admitting: Cardiovascular Disease

## 2020-07-26 DIAGNOSIS — I5043 Acute on chronic combined systolic (congestive) and diastolic (congestive) heart failure: Secondary | ICD-10-CM

## 2020-07-26 DIAGNOSIS — I25708 Atherosclerosis of coronary artery bypass graft(s), unspecified, with other forms of angina pectoris: Secondary | ICD-10-CM

## 2020-07-26 DIAGNOSIS — N529 Male erectile dysfunction, unspecified: Secondary | ICD-10-CM

## 2020-07-26 DIAGNOSIS — I10 Essential (primary) hypertension: Secondary | ICD-10-CM

## 2020-07-26 DIAGNOSIS — Z87891 Personal history of nicotine dependence: Secondary | ICD-10-CM

## 2020-07-26 DIAGNOSIS — E782 Mixed hyperlipidemia: Secondary | ICD-10-CM

## 2020-07-26 MED ORDER — SILDENAFIL CITRATE 20 MG PO TABS: ORAL_TABLET | ORAL | 0 refills | Status: DC

## 2020-07-26 MED ORDER — ATORVASTATIN CALCIUM 10 MG PO TABS: 10.0000 mg | ORAL_TABLET | Freq: Every day | ORAL | 0 refills | Status: DC

## 2020-07-26 MED ORDER — AMLODIPINE BESYLATE 5 MG PO TABS: 5.0000 mg | ORAL_TABLET | Freq: Every day | ORAL | 0 refills | Status: DC

## 2020-07-26 MED ORDER — TELMISARTAN 40 MG PO TABS: ORAL_TABLET | ORAL | 0 refills | Status: DC

## 2020-07-26 MED ORDER — METOPROLOL TARTRATE 100 MG PO TABS: 100.0000 mg | ORAL_TABLET | Freq: Two times a day (BID) | ORAL | 0 refills | Status: DC

## 2020-07-26 NOTE — Telephone Encounter (Signed)
*  STAT* If patient is at the pharmacy, call can be transferred to refill team.   1. Which medications need to be refilled? (please list name of each medication and dose if known) all cardiac medications  2. Which pharmacy/location (including street and city if local pharmacy) is medication to be sent to?[facility] [address]  3. Do they need a 30 day or 90 day supply? 90

## 2020-07-26 NOTE — Telephone Encounter (Signed)
Rx request sent to pharmacy.  

## 2020-08-20 ENCOUNTER — Other Ambulatory Visit: Payer: Self-pay | Admitting: Family

## 2020-08-20 DIAGNOSIS — I25708 Atherosclerosis of coronary artery bypass graft(s), unspecified, with other forms of angina pectoris: Secondary | ICD-10-CM

## 2020-08-20 DIAGNOSIS — E782 Mixed hyperlipidemia: Secondary | ICD-10-CM

## 2020-08-20 DIAGNOSIS — Z87891 Personal history of nicotine dependence: Secondary | ICD-10-CM

## 2020-08-24 ENCOUNTER — Other Ambulatory Visit: Payer: Self-pay | Admitting: Family

## 2020-08-24 DIAGNOSIS — Z87891 Personal history of nicotine dependence: Secondary | ICD-10-CM

## 2020-08-24 DIAGNOSIS — I25708 Atherosclerosis of coronary artery bypass graft(s), unspecified, with other forms of angina pectoris: Secondary | ICD-10-CM

## 2020-08-24 DIAGNOSIS — E782 Mixed hyperlipidemia: Secondary | ICD-10-CM

## 2020-08-27 ENCOUNTER — Telehealth: Payer: Self-pay | Admitting: Cardiovascular Disease

## 2020-08-27 DIAGNOSIS — Z87891 Personal history of nicotine dependence: Secondary | ICD-10-CM

## 2020-08-27 DIAGNOSIS — E782 Mixed hyperlipidemia: Secondary | ICD-10-CM

## 2020-08-27 DIAGNOSIS — I25708 Atherosclerosis of coronary artery bypass graft(s), unspecified, with other forms of angina pectoris: Secondary | ICD-10-CM

## 2020-08-27 MED ORDER — ATORVASTATIN CALCIUM 10 MG PO TABS: ORAL_TABLET | ORAL | 0 refills | Status: DC

## 2020-08-27 NOTE — Telephone Encounter (Signed)
Requested Prescriptions   Signed Prescriptions Disp Refills   atorvastatin (LIPITOR) 10 MG tablet 90 tablet 0    Sig: TAKE 1 TABLET(10 MG) BY MOUTH DAILY    Authorizing Provider: Antonieta Iba    Ordering User: Thayer Headings, Eliakim Tendler L

## 2020-08-27 NOTE — Telephone Encounter (Signed)
*  STAT* If patient is at the pharmacy, call can be transferred to refill team.   1. Which medications need to be refilled? (please list name of each medication and dose if known) atorvastatin 10 mg  2. Which pharmacy/location (including street and city if local pharmacy) is medication to be sent to? [facility] on [address]  3. Do they need a 30 day or 90 day supply? 90

## 2020-09-13 ENCOUNTER — Other Ambulatory Visit: Payer: Self-pay | Admitting: Family

## 2020-09-13 DIAGNOSIS — E782 Mixed hyperlipidemia: Secondary | ICD-10-CM

## 2020-09-13 DIAGNOSIS — Z87891 Personal history of nicotine dependence: Secondary | ICD-10-CM

## 2020-09-13 DIAGNOSIS — I25708 Atherosclerosis of coronary artery bypass graft(s), unspecified, with other forms of angina pectoris: Secondary | ICD-10-CM

## 2020-11-04 NOTE — Progress Notes (Signed)
Cardiology Office Note  Date:  11/05/2020   ID:  [name], James [name] 1956/04/12, MRN [id number]  PCP:  Leotis Shames, MD   Chief Complaint  Patient presents with  . 12 month follow up     "doing well." Medications reviewed by the patient verbally.     HPI:  [name] is a  65 year old gentleman with history of  hypertension,  paroxysmal atrial fibrillation in February 2016, previously declined anticoagulation smoker 40 years,  Quit  2015 experienced substernal chest discomfort 03/20/14 and presented to [facility] Room,   EKG revealed ST elevations in the anterior leads consistent with ST elevation myocardial infarction,    Cardiac catheterization was performed,  high-grade stenosis in the distal left main and ostium of the left anterior descending coronary artery with TIMI-3 flow.   CABG at [facility].  March 25, 2014 afib in 2016 He presents for Follow-up of his coronary artery disease   LOV with CW on 09/2019 At that time was doing well, no complaints  On today's visit very active, walks the graveyard, Atypical chest pain bending over and standing Not with exercise  Walking 3-4 x a week 35-45 min No angina, no SOB  Retired, used to drive bus  Started fishing, golf  No recent lab work available Lab work from last year, Reports he has established with a new primary care on [address] but he does not know the name Previously seen by [facility], does not follow up there anymore Last lab work is 2018  EKG personally reviewed by myself on todays visit Shows normal sinus rhythm with rate 62 bpm old anterior MI, T wave abnormality precordial leads  Other past medical history reviewed Was declining colonoscopy, Hemoccult testing performed PSA 7.04 September 2016 with significant increase from June 2015, he was referred to urology  Echo 11/16/16 Left ventricle: The cavity size was normal. Wall thickness was  normal. Systolic function was normal. The estimated  ejection   fraction was in the range of 50% to 55%. Left ventricular   diastolic function parameters were normal for the patient&'s age.  ETT 11/16/2016 Normal exercise stress test Good exercise tolerance    previously on anticoagulation, eliquis but was unable to afford this long-term Denied any bleeding complications CHADS VASC of 3 Even if he was able to afford the medication, he is not sure if he would want to be on it Not particular interested in warfarin His father is on anticoagulation  Elevated PSA of 7.5 in February 2018 significantly increased from prior PSA of 4.75 in June 2015. lipid profile without statin, LDL 56, HDL 39 and total cholesterol 133. Echocardiogram May 2017 shows an EF of 40%. By echo    PMH:   has a past medical history of A-fib [facility]), Coronary artery disease (03/25/2014), Hyperlipidemia, Hypertension, NSTEMI (non-ST elevated myocardial infarction) (HCC), and Systolic dysfunction with heart failure (HCC).  PSH:    Past Surgical History:  Procedure Laterality Date  . CARDIAC CATHETERIZATION  2015  . CORONARY ARTERY BYPASS GRAFT  02/2014   [facility]    Current Outpatient Medications  Medication Sig Dispense Refill  . amLODipine (NORVASC) 5 MG tablet Take 1 tablet (5 mg total) by mouth daily. 90 tablet 0  . aspirin EC 81 MG tablet Take 81 mg by mouth daily.     Marland Kitchen atorvastatin (LIPITOR) 10 MG tablet TAKE 1 TABLET(10 MG) BY MOUTH DAILY 90 tablet 0  . metoprolol tartrate (LOPRESSOR) 100 MG tablet Take 1  tablet (100 mg total) by mouth 2 (two) times daily. 180 tablet 0  . sildenafil (REVATIO) 20 MG tablet 1 tablet three times daily as needed as directed per doctor 90 tablet 0  . telmisartan (MICARDIS) 40 MG tablet TAKE ONE (1) TABLET EACH DAY 90 tablet 0   No current facility-administered medications for this visit.     Allergies:   Codeine   Social History:  The patient  reports that he quit smoking about 6 years ago. His smoking use included cigarettes.  He has a 40.00 pack-year smoking history. He has never used smokeless tobacco. He reports current alcohol use. He reports that he does not use drugs.   Family History:   family history includes Diabetes gravidarum in his brother and father; Heart attack (age of onset: 56) in his father; Heart disease in his father.    Review of Systems: Review of Systems  Constitutional: Negative.   Respiratory: Negative.   Cardiovascular: Negative.   Gastrointestinal: Negative.   Musculoskeletal: Negative.   Neurological: Negative.   Psychiatric/Behavioral: Negative.   All other systems reviewed and are negative.   PHYSICAL EXAM: VS:  BP 140/84 (BP Location: Left Arm, Patient Position: Sitting, Cuff Size: Normal)   Pulse 62   Ht 5\' 9"  (1.753 m)   Wt 186 lb 8 oz (84.6 kg)   SpO2 98%   BMI 27.54 kg/m  , BMI Body mass index is 27.54 kg/m.  Constitutional:  oriented to person, place, and time. No distress.  HENT:  Head: Grossly normal Eyes:  no discharge. No scleral icterus.  Neck: No JVD, no carotid bruits  Cardiovascular: Regular rate and rhythm, no murmurs appreciated Pulmonary/Chest: Clear to auscultation bilaterally, no wheezes or rails Abdominal: Soft.  no distension.  no tenderness.  Musculoskeletal: Normal range of motion Neurological:  normal muscle tone. Coordination normal. No atrophy Skin: Skin warm and dry Psychiatric: normal affect, pleasant   Recent Labs: No results found for requested labs within last 8760 hours.    Lipid Panel No results found for: CHOL, HDL, LDLCALC, TRIG    Wt Readings from Last 3 Encounters:  11/05/20 186 lb 8 oz (84.6 kg)  10/28/19 187 lb (84.8 kg)  08/13/17 191 lb 12 oz (87 kg)       ASSESSMENT AND PLAN:  Acute on chronic combined systolic and diastolic CHF (congestive heart failure) (HCC) -  Appears euvolemic, No change to medications  Hypertension, unspecified type Blood pressure is well controlled on today's visit. No changes made  to the medications.  Coronary artery disease of bypass graft of native heart with stable angina pectoris (HCC) Currently with no symptoms of angina. No further workup at this time. Continue current medication regimen.  Mixed hyperlipidemia Repeat labs today  Smoking history Stopped smoking several years ago, 2015   Paroxysmal atrial fibrillation Denies any palpitations or symptoms concerning for arrhythmia CHADS VASC of 3 Previously declined anticoagulation and on previous visits, aware of stroke risk Discussed with him again, has poor follow-up in primary care clinic Stressed importance of being extra vigilant concerning his tachypalpitations and to let 2016 know    Total encounter time more than 25 minutes  Greater than 50% was spent in counseling and coordination of care with the patient    No orders of the defined types were placed in this encounter.    Signed, [place], M.D., Ph.D. 11/05/2020  [facility] [place], [place]

## 2020-11-05 ENCOUNTER — Encounter: Payer: Self-pay | Admitting: Cardiovascular Disease

## 2020-11-05 ENCOUNTER — Other Ambulatory Visit: Payer: Self-pay

## 2020-11-05 ENCOUNTER — Other Ambulatory Visit
Admission: RE | Admit: 2020-11-05 | Discharge: 2020-11-05 | Disposition: A | Discharge status: Home / Self Care | Payer: Medicare Other | Attending: Cardiovascular Disease | Admitting: Cardiovascular Disease

## 2020-11-05 ENCOUNTER — Other Ambulatory Visit: Payer: Self-pay | Admitting: Cardiovascular Disease

## 2020-11-05 ENCOUNTER — Ambulatory Visit: Payer: Medicare Other | Admitting: Cardiovascular Disease

## 2020-11-05 VITALS — BP 140/84 | HR 62 | Ht 69.0 in | Wt 186.5 lb

## 2020-11-05 DIAGNOSIS — E782 Mixed hyperlipidemia: Secondary | ICD-10-CM

## 2020-11-05 DIAGNOSIS — N529 Male erectile dysfunction, unspecified: Secondary | ICD-10-CM

## 2020-11-05 DIAGNOSIS — I5043 Acute on chronic combined systolic (congestive) and diastolic (congestive) heart failure: Secondary | ICD-10-CM

## 2020-11-05 DIAGNOSIS — Z87891 Personal history of nicotine dependence: Secondary | ICD-10-CM | POA: Diagnosis not present

## 2020-11-05 DIAGNOSIS — I25708 Atherosclerosis of coronary artery bypass graft(s), unspecified, with other forms of angina pectoris: Secondary | ICD-10-CM | POA: Insufficient documentation

## 2020-11-05 DIAGNOSIS — I10 Essential (primary) hypertension: Secondary | ICD-10-CM | POA: Diagnosis not present

## 2020-11-05 LAB — COMPREHENSIVE METABOLIC PANEL
ALT: 23 U/L (ref 0–44)
AST: 24 U/L (ref 15–41)
Albumin: 4.3 g/dL (ref 3.5–5.0)
Alkaline Phosphatase: 55 U/L (ref 38–126)
Anion gap: 9 (ref 5–15)
BUN: 15 mg/dL (ref 8–23)
CO2: 21 mmol/L — ABNORMAL LOW (ref 22–32)
Calcium: 9 mg/dL (ref 8.9–10.3)
Chloride: 110 mmol/L (ref 98–111)
Creatinine, Ser: 0.88 mg/dL (ref 0.61–1.24)
GFR, Estimated: >60 mL/min (ref >60)
Glucose, Bld: 112 mg/dL — ABNORMAL HIGH (ref 70–99)
Potassium: 4 mmol/L (ref 3.5–5.1)
Sodium: 140 mmol/L (ref 135–145)
Total Bilirubin: 0.9 mg/dL (ref 0.3–1.2)
Total Protein: 7.5 g/dL (ref 6.5–8.1)

## 2020-11-05 LAB — LIPID PANEL
Cholesterol: 95 mg/dL (ref 0–200)
HDL: 39 mg/dL — ABNORMAL LOW (ref >40)
LDL Cholesterol: 26 mg/dL (ref 0–99)
Total CHOL/HDL Ratio: 2.4 RATIO
Triglycerides: 152 mg/dL — ABNORMAL HIGH (ref <150)
VLDL: 30 mg/dL (ref 0–40)

## 2020-11-05 MED ORDER — SILDENAFIL CITRATE 20 MG PO TABS: ORAL_TABLET | ORAL | 0 refills | Status: DC

## 2020-11-05 MED ORDER — TELMISARTAN 40 MG PO TABS: ORAL_TABLET | ORAL | 3 refills | Status: DC

## 2020-11-05 MED ORDER — AMLODIPINE BESYLATE 5 MG PO TABS: 5.0000 mg | ORAL_TABLET | Freq: Every day | ORAL | 3 refills | Status: DC

## 2020-11-05 MED ORDER — METOPROLOL TARTRATE 100 MG PO TABS: 100.0000 mg | ORAL_TABLET | Freq: Two times a day (BID) | ORAL | 3 refills | Status: DC

## 2020-11-05 MED ORDER — ATORVASTATIN CALCIUM 10 MG PO TABS: ORAL_TABLET | ORAL | 3 refills | Status: DC

## 2020-11-05 NOTE — Patient Instructions (Addendum)
  Medication Instructions:  No changes  Your cardiac medications have been refilled, you can pick them up at your pharmacy today  Lab work: Labs today: CMP, lipids  We will call with results.    Testing/Procedures: No new testing needed   Follow-Up:   . You will need a follow up appointment in 12 months  . Providers on your designated Care Team:   . Nicolasa Ducking, NP . Eula Listen, PA-C . Marisue Ivan, PA-C   COVID-19 Vaccine Information can be found at: [email or web address] For questions related to vaccine distribution or appointments, please email [email or web address] or call [phone].

## 2020-11-10 ENCOUNTER — Telehealth: Payer: Self-pay

## 2020-11-10 NOTE — Telephone Encounter (Signed)
Attempted to reach pt regarding recent labs, unable to leave message.

## 2020-11-10 NOTE — Telephone Encounter (Signed)
Able to reach pt regarding his recent lab work, Dr. Mariah Milling had a chance to review his results and advised   "Cholesterol is at goal on the current lipid regimen. No changes to the medications were made"  Pt grateful for the phone call, all questions or concerns were address and no additional concerns at this time.

## 2021-05-05 ENCOUNTER — Other Ambulatory Visit: Payer: Self-pay | Admitting: Family

## 2021-05-05 DIAGNOSIS — N529 Male erectile dysfunction, unspecified: Secondary | ICD-10-CM

## 2021-05-05 NOTE — Telephone Encounter (Signed)
Please advise if ok to refill non cardiac medication. 

## 2021-09-21 ENCOUNTER — Other Ambulatory Visit: Payer: Self-pay | Admitting: Cardiovascular Disease

## 2021-09-21 DIAGNOSIS — E782 Mixed hyperlipidemia: Secondary | ICD-10-CM

## 2021-09-21 DIAGNOSIS — Z87891 Personal history of nicotine dependence: Secondary | ICD-10-CM

## 2021-09-21 DIAGNOSIS — I25708 Atherosclerosis of coronary artery bypass graft(s), unspecified, with other forms of angina pectoris: Secondary | ICD-10-CM

## 2021-09-21 NOTE — Telephone Encounter (Signed)
Please schedule 12 month F/U appointment for further refills. Thank you! 

## 2021-09-21 NOTE — Telephone Encounter (Signed)
Attempted to schedule no ans no vm  

## 2021-11-02 ENCOUNTER — Other Ambulatory Visit: Payer: Self-pay | Admitting: Cardiovascular Disease

## 2021-11-02 DIAGNOSIS — I5043 Acute on chronic combined systolic (congestive) and diastolic (congestive) heart failure: Secondary | ICD-10-CM

## 2021-11-02 DIAGNOSIS — I10 Essential (primary) hypertension: Secondary | ICD-10-CM

## 2021-11-02 DIAGNOSIS — I25708 Atherosclerosis of coronary artery bypass graft(s), unspecified, with other forms of angina pectoris: Secondary | ICD-10-CM

## 2021-11-16 NOTE — Progress Notes (Signed)
Cardiology Office Note ? ?Date:  11/21/2021  ? ?ID:  James [name], DOB 07-27-1956, MRN [id number] ? ?PCP:  James Closs, MD  ? ?Chief Complaint  ?Patient presents with  ? 12 month follow up   ?  "Doing well." Medications reviewed by the patient verbally.   ? ? ?HPI:  ?[name] is a  66 year old gentleman with history of  ?CAD ?hypertension,  ?paroxysmal atrial fibrillation in February 2016, previously declined anticoagulation ?smoker 40 years,  Quit  2015 ?experienced substernal chest discomfort 03/20/14 and presented to [facility] Emergency Room,   ?EKG revealed ST elevations in the anterior leads consistent with ST elevation myocardial infarction,    ?Cardiac catheterization was performed,  ?high-grade stenosis in the distal left main and ostium of the left anterior descending coronary artery with TIMI-3 flow.   ?CABG at [facility].  March 25, 2014 ?He presents for Follow-up of his coronary artery disease  ? ?Last seen by myself in clinic April 2022 ? ?Active, plays golf, fishing ?Walking less 2-3 x a week, walks graveyard ? ?No chest pain ?Retired, used to drive bus ? ?Nonsmoker, total chol <199, no diabetes ? ?BP at home 120 systolic ? ?No sx concerning for atrial fibrillation, previously decined anticoagulation ? ?EKG personally reviewed by myself on todays visit ?hows normal sinus rhythm with rate 58 bpm old anterior MI, T wave abnormality precordial leads, no change from prior EKG ? ?Other past medical history reviewed ?Was declining colonoscopy, Hemoccult testing performed ?PSA 7.04 September 2016 with significant increase from June 2015, he was referred to urology ? ?Echo 11/16/16 ?Left ventricle: The cavity size was normal. Wall thickness was ? normal. Systolic function was normal. The estimated ejection ?  fraction was in the range of 50% to 55%. Left ventricular ?  diastolic function parameters were normal for the patient&'s age. ? ?ETT ?11/16/2016 ?Normal exercise stress test ?Good exercise  tolerance ? ? previously on anticoagulation, eliquis but was unable to afford this long-term ?Denied any bleeding complications ?CHADS VASC of 3 ?Even if he was able to afford the medication, he is not sure if he would want to be on it ?Not particular interested in warfarin ?His father is on anticoagulation ? ? ?PMH:   has a past medical history of A-fib (HCC), Coronary artery disease (03/25/2014), Hyperlipidemia, Hypertension, NSTEMI (non-ST elevated myocardial infarction) (HCC), and Systolic dysfunction with heart failure (HCC). ? ?PSH:    ?Past Surgical History:  ?Procedure Laterality Date  ? CARDIAC CATHETERIZATION  2015  ? CORONARY ARTERY BYPASS GRAFT  02/2014  ? [facility]  ? ? ?Current Outpatient Medications  ?Medication Sig Dispense Refill  ? amLODipine (NORVASC) 5 MG tablet Take 1 tablet (5 mg total) by mouth daily. 90 tablet 3  ? aspirin EC 81 MG tablet Take 81 mg by mouth daily.     ? atorvastatin (LIPITOR) 10 MG tablet TAKE 1 TABLET(10 MG) BY MOUTH DAILY. PLEASE SCHEDULE OFFICE VISIT FOR FURTHER REFILLS. THANK YOU! 90 tablet 0  ? metoprolol tartrate (LOPRESSOR) 100 MG tablet Take 1 tablet (100 mg total) by mouth 2 (two) times daily. 180 tablet 3  ? sildenafil (REVATIO) 20 MG tablet TAKE AS DIRECTED BY DOCTOR 90 tablet 1  ? telmisartan (MICARDIS) 40 MG tablet TAKE 1 TABLET BY MOUTH EACH DAY 90 tablet 3  ? ?No current facility-administered medications for this visit.  ? ? ? ?Allergies:   Codeine  ? ?Social History:  The patient  reports that he quit smoking  about 7 years ago. His smoking use included cigarettes. He has a 40.00 pack-year smoking history. He has never used smokeless tobacco. He reports current alcohol use. He reports that he does not use drugs.  ? ?Family History:   family history includes Diabetes gravidarum in his brother and father; Heart attack (age of onset: 29) in his father; Heart disease in his father.  ? ? ?Review of Systems: ?Review of Systems  ?Constitutional: Negative.   ?Respiratory:  Negative.    ?Cardiovascular: Negative.   ?Gastrointestinal: Negative.   ?Musculoskeletal: Negative.   ?Neurological: Negative.   ?Psychiatric/Behavioral: Negative.    ?All other systems reviewed and are negative. ? ?PHYSICAL EXAM: ?VS:  BP 140/70 (BP Location: Left Arm, Patient Position: Sitting, Cuff Size: Normal)   Pulse (!) 58   Ht 5\' 9"  (1.753 m)   Wt 185 lb 8 oz (84.1 kg)   SpO2 98%   BMI 27.39 kg/m?  , BMI Body mass index is 27.39 kg/m?  ?Constitutional:  oriented to person, place, and time. No distress.  ?HENT:  ?Head: Grossly normal ?Eyes:  no discharge. No scleral icterus.  ?Neck: No JVD, no carotid bruits  ?Cardiovascular: Regular rate and rhythm, no murmurs appreciated ?Pulmonary/Chest: Clear to auscultation bilaterally, no wheezes or rails ?Abdominal: Soft.  no distension.  no tenderness.  ?Musculoskeletal: Normal range of motion ?Neurological:  normal muscle tone. Coordination normal. No atrophy ?Skin: Skin warm and dry ?Psychiatric: normal affect, pleasant ? ?Recent Labs: ?No results found for requested labs within last 8760 hours.  ? ? ?Lipid Panel ?Lab Results  ?Component Value Date  ? CHOL 95 11/05/2020  ? HDL 39 (L) 11/05/2020  ? LDLCALC 26 11/05/2020  ? TRIG 152 (H) 11/05/2020  ? ?  ? ?Wt Readings from Last 3 Encounters:  ?11/21/21 185 lb 8 oz (84.1 kg)  ?11/05/20 186 lb 8 oz (84.6 kg)  ?10/28/19 187 lb (84.8 kg)  ?  ? ?ASSESSMENT AND PLAN: ? ?Acute on chronic combined systolic and diastolic CHF (congestive heart failure) (HCC) -  ?Euvolemic on today's visit, no changes to his medications ? ?Hypertension, unspecified type ?Blood pressure is well controlled on today's visit. No changes made to the medications. ? ?Coronary artery disease of bypass graft of native heart with stable angina pectoris (HCC) ?Currently with no symptoms of angina. No further workup at this time. Continue current medication regimen. ?Discussed anginal symptoms to watch for ? ?Mixed hyperlipidemia ?Cholesterol is at  goal on the current lipid regimen. No changes to the medications were made. ? ?Smoking history ?Stopped smoking several years ago, 2015  ? ?Paroxysmal atrial fibrillation ?Denies any palpitations or symptoms concerning for arrhythmia ?Again this was reiterated with him ?CHADS VASC of 3, declining anticoagulation ?Previously declined anticoagulation and on previous visits, aware of stroke risk ?Denies any new symptoms concerning for arrhythmia ? ? ? Total encounter time more than 30 minutes ? Greater than 50% was spent in counseling and coordination of care with the patient ? ? ? ?Orders Placed This Encounter  ?Procedures  ? EKG 12-Lead  ? ? ? ?Signed, ?2016, M.D., Ph.D. ?11/21/2021  ?[facility], [place] ?[phone] ? ?

## 2021-11-21 ENCOUNTER — Ambulatory Visit (INDEPENDENT_AMBULATORY_CARE_PROVIDER_SITE_OTHER): Payer: Medicare PPO | Admitting: Cardiovascular Disease

## 2021-11-21 ENCOUNTER — Encounter: Payer: Self-pay | Admitting: Cardiovascular Disease

## 2021-11-21 VITALS — BP 140/70 | HR 58 | Ht 69.0 in | Wt 185.5 lb

## 2021-11-21 DIAGNOSIS — I25708 Atherosclerosis of coronary artery bypass graft(s), unspecified, with other forms of angina pectoris: Secondary | ICD-10-CM | POA: Diagnosis not present

## 2021-11-21 DIAGNOSIS — I10 Essential (primary) hypertension: Secondary | ICD-10-CM

## 2021-11-21 DIAGNOSIS — E782 Mixed hyperlipidemia: Secondary | ICD-10-CM | POA: Diagnosis not present

## 2021-11-21 DIAGNOSIS — Z87891 Personal history of nicotine dependence: Secondary | ICD-10-CM | POA: Diagnosis not present

## 2021-11-21 NOTE — Patient Instructions (Addendum)
Medication Instructions:  No changes  If you need a refill on your cardiac medications before your next appointment, please call your pharmacy.   Lab work: No new labs needed  Testing/Procedures: No new testing needed  Follow-Up: At [facility], you and your health needs are our priority.  As part of our continuing mission to provide you with exceptional heart care, we have created designated Provider Care Teams.  These Care Teams include your primary Cardiologist (physician) and Advanced Practice Providers (APPs -  Physician Assistants and Nurse Practitioners) who all work together to provide you with the care you need, when you need it.  You will need a follow up appointment in 12 months  Providers on your designated Care Team:   Christopher Berge, NP Ryan Dunn, PA-C Cadence Furth, PA-C  COVID-19 Vaccine Information can be found at: [email or web address] For questions related to vaccine distribution or appointments, please email [email or web address] or call [phone].   

## 2021-12-21 ENCOUNTER — Other Ambulatory Visit: Payer: Self-pay | Admitting: Cardiovascular Disease

## 2021-12-21 DIAGNOSIS — I10 Essential (primary) hypertension: Secondary | ICD-10-CM

## 2021-12-21 DIAGNOSIS — E782 Mixed hyperlipidemia: Secondary | ICD-10-CM

## 2021-12-21 DIAGNOSIS — Z87891 Personal history of nicotine dependence: Secondary | ICD-10-CM

## 2021-12-21 DIAGNOSIS — I5043 Acute on chronic combined systolic (congestive) and diastolic (congestive) heart failure: Secondary | ICD-10-CM

## 2021-12-21 DIAGNOSIS — I25708 Atherosclerosis of coronary artery bypass graft(s), unspecified, with other forms of angina pectoris: Secondary | ICD-10-CM

## 2022-11-14 ENCOUNTER — Other Ambulatory Visit: Payer: Self-pay

## 2022-11-14 ENCOUNTER — Telehealth: Payer: Self-pay | Admitting: Cardiovascular Disease

## 2022-11-14 DIAGNOSIS — Z87891 Personal history of nicotine dependence: Secondary | ICD-10-CM

## 2022-11-14 DIAGNOSIS — I25708 Atherosclerosis of coronary artery bypass graft(s), unspecified, with other forms of angina pectoris: Secondary | ICD-10-CM

## 2022-11-14 DIAGNOSIS — E782 Mixed hyperlipidemia: Secondary | ICD-10-CM

## 2022-11-14 DIAGNOSIS — I5043 Acute on chronic combined systolic (congestive) and diastolic (congestive) heart failure: Secondary | ICD-10-CM

## 2022-11-14 DIAGNOSIS — I10 Essential (primary) hypertension: Secondary | ICD-10-CM

## 2022-11-14 MED ORDER — TELMISARTAN 40 MG PO TABS: ORAL_TABLET | ORAL | 3 refills | Status: DC

## 2022-11-14 MED ORDER — ATORVASTATIN CALCIUM 10 MG PO TABS: ORAL_TABLET | ORAL | 3 refills | Status: DC

## 2022-11-14 MED ORDER — AMLODIPINE BESYLATE 5 MG PO TABS: ORAL_TABLET | ORAL | 3 refills | Status: DC

## 2022-11-14 MED ORDER — METOPROLOL TARTRATE 100 MG PO TABS: ORAL_TABLET | ORAL | 3 refills | Status: DC

## 2022-11-14 NOTE — Telephone Encounter (Signed)
Requested Prescriptions   Signed Prescriptions Disp Refills   amLODipine (NORVASC) 5 MG tablet 90 tablet 3    Sig: TAKE 1 TABLET(5 MG) BY MOUTH DAILY    Authorizing Provider: GOLLAN, TIMOTHY J    Ordering User: Rocky Gladden N   atorvastatin (LIPITOR) 10 MG tablet 90 tablet 3    Sig: TAKE 1 TABLET(10 MG) BY MOUTH DAILY    Authorizing Provider: GOLLAN, TIMOTHY J    Ordering User: Novalyn Lajara N   metoprolol tartrate (LOPRESSOR) 100 MG tablet 180 tablet 3    Sig: TAKE 1 TABLET(100 MG) BY MOUTH TWICE DAILY    Authorizing Provider: GOLLAN, TIMOTHY J    Ordering User: Jaidin Richison N   telmisartan (MICARDIS) 40 MG tablet 90 tablet 3    Sig: TAKE 1 TABLET BY MOUTH EACH DAY    Authorizing Provider: GOLLAN, TIMOTHY J    Ordering User: Moesha Sarchet N    

## 2022-11-14 NOTE — Telephone Encounter (Signed)
Requested Prescriptions   Signed Prescriptions Disp Refills   amLODipine (NORVASC) 5 MG tablet 90 tablet 3    Sig: TAKE 1 TABLET(5 MG) BY MOUTH DAILY    Authorizing Provider: Antonieta Iba    Ordering User: Margrett Rud   atorvastatin (LIPITOR) 10 MG tablet 90 tablet 3    Sig: TAKE 1 TABLET(10 MG) BY MOUTH DAILY    Authorizing Provider: Antonieta Iba    Ordering User: Margrett Rud   metoprolol tartrate (LOPRESSOR) 100 MG tablet 180 tablet 3    Sig: TAKE 1 TABLET(100 MG) BY MOUTH TWICE DAILY    Authorizing Provider: Antonieta Iba    Ordering User: Margrett Rud   telmisartan (MICARDIS) 40 MG tablet 90 tablet 3    Sig: TAKE 1 TABLET BY MOUTH EACH DAY    Authorizing Provider: Antonieta Iba    Ordering User: Margrett Rud

## 2022-11-14 NOTE — Telephone Encounter (Signed)
*  STAT* If patient is at the pharmacy, call can be transferred to refill team.   1. Which medications need to be refilled? (please list name of each medication and dose if known) Atotvastatin 10 mg tablet Metroprolol tartrate 100 mg tablet Sildenafil 20 mg tablet Telmisartan 40 mg tablet  2. Which pharmacy/location (including street and city if local pharmacy) is medication to be sent to? [facility] [address]  3. Do they need a 30 day or 90 day supply? 90 day supply

## 2023-02-25 NOTE — Progress Notes (Unsigned)
Cardiology Office Note  Date:  02/26/2023   ID:  James, [name] 02-18-1956, MRN [id number]  PCP:  Hughie Closs, MD   Chief Complaint  Patient presents with   12 month follow up     "Doing well." Medications reviewed by the patient verbally.     HPI:  [name] is a  67 year-old gentleman with history of  CAD hypertension,  paroxysmal atrial fibrillation in February 2016, previously declined anticoagulation smoker 40 years,  Quit  2015 experienced substernal chest discomfort 03/20/14 and presented to [facility] Emergency Room,   EKG revealed ST elevations in the anterior leads consistent with ST elevation myocardial infarction,    Cardiac catheterization was performed,  high-grade stenosis in the distal left main and ostium of the left anterior descending coronary artery with TIMI-3 flow.   CABG at [facility].  March 25, 2014 He presents for Follow-up of his coronary artery disease , PAF  Last seen by myself in clinic April 2023 Walks daily, Active, plays golf, fishing walks graveyard  No chest pain, no SOB Retired, used to drive bus  Nonsmoker, total chol <150, no diabetes  Has not been checking blood pressure at home  No sx concerning for atrial fibrillation, previously decined anticoagulation  EKG personally reviewed by myself on todays visit EKG Interpretation Date/Time:  Monday February 26 2023 08:23:01 EDT Ventricular Rate:  69 PR Interval:  170 QRS Duration:  130 QT Interval:  412 QTC Calculation: 441 R Axis:   -54  Text Interpretation: Normal sinus rhythm Right bundle branch block Anterior infarct (cited on or before 06-Nov-2014) T wave abnormality, consider lateral ischemia When compared with ECG of 06-Nov-2014 06:55, Questionable change in QRS axis Nonspecific T wave abnormality, improved in Inferior leads T wave inversion more evident in Anterolateral leads Confirmed by Julien Nordmann [phone]) on 02/26/2023 8:29:04 AM  Unchanged from prior study  April 2023   Other past medical history reviewed Was declining colonoscopy, Hemoccult testing performed PSA 7.04 September 2016 with significant increase from June 2015, he was referred to urology  Echo 11/16/16 Left ventricle: The cavity size was normal. Wall thickness was  normal. Systolic function was normal. The estimated ejection   fraction was in the range of 50% to 55%. Left ventricular   diastolic function parameters were normal for the patient&'s age.  ETT 11/16/2016 Normal exercise stress test Good exercise tolerance   previously on anticoagulation, eliquis but was unable to afford this long-term Denied any bleeding complications CHADS VASC of 3 Even if he was able to afford the medication, he is not sure if he would want to be on it Not particular interested in warfarin His father is on anticoagulation   PMH:   has a past medical history of A-fib [facility]), Coronary artery disease (03/25/2014), Hyperlipidemia, Hypertension, NSTEMI (non-ST elevated myocardial infarction) (HCC), and Systolic dysfunction with heart failure (HCC).  PSH:    Past Surgical History:  Procedure Laterality Date   CARDIAC CATHETERIZATION  2015   CORONARY ARTERY BYPASS GRAFT  02/2014   [facility]    Current Outpatient Medications  Medication Sig Dispense Refill   amLODipine (NORVASC) 5 MG tablet TAKE 1 TABLET(5 MG) BY MOUTH DAILY 90 tablet 3   aspirin EC 81 MG tablet Take 81 mg by mouth daily.      atorvastatin (LIPITOR) 10 MG tablet TAKE 1 TABLET(10 MG) BY MOUTH DAILY 90 tablet 3   metoprolol tartrate (LOPRESSOR) 100 MG tablet TAKE 1 TABLET(100 MG) BY  MOUTH TWICE DAILY 180 tablet 3   sildenafil (REVATIO) 20 MG tablet TAKE AS DIRECTED BY DOCTOR 90 tablet 1   telmisartan (MICARDIS) 40 MG tablet TAKE 1 TABLET BY MOUTH EACH DAY 90 tablet 3   No current facility-administered medications for this visit.     Allergies:   Codeine   Social History:  The patient  reports that he quit smoking about 9 years  ago. His smoking use included cigarettes. He started smoking about 49 years ago. He has a 40 pack-year smoking history. He has never used smokeless tobacco. He reports current alcohol use. He reports that he does not use drugs.   Family History:   family history includes Diabetes gravidarum in his brother and father; Heart attack (age of onset: 27) in his father; Heart disease in his father.    Review of Systems: Review of Systems  Constitutional: Negative.   Respiratory: Negative.    Cardiovascular: Negative.   Gastrointestinal: Negative.   Musculoskeletal: Negative.   Neurological: Negative.   Psychiatric/Behavioral: Negative.    All other systems reviewed and are negative.   PHYSICAL EXAM: VS:  BP (!) 140/80 (BP Location: Left Arm, Patient Position: Sitting, Cuff Size: Normal)   Pulse 69   Ht 5\' 10"  (1.778 m)   Wt 187 lb 2 oz (84.9 kg)   SpO2 97%   BMI 26.85 kg/m  , BMI Body mass index is 26.85 kg/m.  Constitutional:  oriented to person, place, and time. No distress.  HENT:  Head: Grossly normal Eyes:  no discharge. No scleral icterus.  Neck: No JVD, no carotid bruits  Cardiovascular: Regular rate and rhythm, no murmurs appreciated Pulmonary/Chest: Clear to auscultation bilaterally, no wheezes or rails Abdominal: Soft.  no distension.  no tenderness.  Musculoskeletal: Normal range of motion Neurological:  normal muscle tone. Coordination normal. No atrophy Skin: Skin warm and dry Psychiatric: normal affect, pleasant   Recent Labs: No results found for requested labs within last 365 days.    Lipid Panel Lab Results  Component Value Date   CHOL 95 11/05/2020   HDL 39 (L) 11/05/2020   LDLCALC 26 11/05/2020   TRIG 152 (H) 11/05/2020      Wt Readings from Last 3 Encounters:  02/26/23 187 lb 2 oz (84.9 kg)  11/21/21 185 lb 8 oz (84.1 kg)  11/05/20 186 lb 8 oz (84.6 kg)     ASSESSMENT AND PLAN:  Hypertension, unspecified type Pressure elevated.  Coor  recommended to monitor blood pressure closely at home  Coronary artery disease of bypass graft of native heart with stable angina pectoris (HCC) Currently with no symptoms of angina. No further workup at this time. Continue current medication regimen. Discussed anginal symptoms to watch for  Mixed hyperlipidemia Lab work ordered today  Smoking history Stopped smoking several years ago, 2015   Paroxysmal atrial fibrillation Denies any palpitations or symptoms concerning for arrhythmia CHADS VASC of 3,  Previously declined anticoagulation and on previous visits, aware of stroke risk Denies any new symptoms concerning for arrhythmia    Total encounter time more than 30 minutes  Greater than 50% was spent in counseling and coordination of care with the patient    Orders Placed This Encounter  Procedures   EKG 12-Lead     Signed, Dossie Arbour, M.D., Ph.D. 02/26/2023  [facility] [place], [place] [phone]

## 2023-02-26 ENCOUNTER — Encounter: Payer: Self-pay | Admitting: Cardiovascular Disease

## 2023-02-26 ENCOUNTER — Ambulatory Visit: Payer: Medicare PPO | Attending: Cardiovascular Disease | Admitting: Cardiovascular Disease

## 2023-02-26 VITALS — BP 140/80 | HR 69 | Ht 70.0 in | Wt 187.1 lb

## 2023-02-26 DIAGNOSIS — E782 Mixed hyperlipidemia: Secondary | ICD-10-CM | POA: Diagnosis not present

## 2023-02-26 DIAGNOSIS — I10 Essential (primary) hypertension: Secondary | ICD-10-CM | POA: Diagnosis not present

## 2023-02-26 DIAGNOSIS — Z79899 Other long term (current) drug therapy: Secondary | ICD-10-CM

## 2023-02-26 DIAGNOSIS — I25708 Atherosclerosis of coronary artery bypass graft(s), unspecified, with other forms of angina pectoris: Secondary | ICD-10-CM

## 2023-02-26 DIAGNOSIS — Z87891 Personal history of nicotine dependence: Secondary | ICD-10-CM

## 2023-02-26 NOTE — Patient Instructions (Addendum)
Medication Instructions:  No changes  If you need a refill on your cardiac medications before your next appointment, please call your pharmacy.   Lab work:  Your provider would like for you to have following labs drawn today Lipids, A1C, CBC and Cmp.    Testing/Procedures: No new testing needed  Follow-Up: At [facility], you and your health needs are our priority.  As part of our continuing mission to provide you with exceptional heart care, we have created designated Provider Care Teams.  These Care Teams include your primary Cardiologist (physician) and Advanced Practice Providers (APPs -  Physician Assistants and Nurse Practitioners) who all work together to provide you with the care you need, when you need it.  You will need a follow up appointment in 12 months  Providers on your designated Care Team:   Nicolasa Ducking, NP Eula Listen, PA-C Cadence Fransico Michael, [place]  COVID-19 Vaccine Information can be found at: [email or web address] For questions related to vaccine distribution or appointments, please email [email or web address] or call [phone].

## 2023-11-14 ENCOUNTER — Other Ambulatory Visit: Payer: Self-pay | Admitting: Cardiovascular Disease

## 2023-11-14 DIAGNOSIS — E782 Mixed hyperlipidemia: Secondary | ICD-10-CM

## 2023-11-14 DIAGNOSIS — Z87891 Personal history of nicotine dependence: Secondary | ICD-10-CM

## 2023-11-14 DIAGNOSIS — I10 Essential (primary) hypertension: Secondary | ICD-10-CM

## 2023-11-14 DIAGNOSIS — I5043 Acute on chronic combined systolic (congestive) and diastolic (congestive) heart failure: Secondary | ICD-10-CM

## 2023-11-14 DIAGNOSIS — I25708 Atherosclerosis of coronary artery bypass graft(s), unspecified, with other forms of angina pectoris: Secondary | ICD-10-CM

## 2024-02-22 NOTE — Progress Notes (Signed)
 Cardiology Office Note  Date:  02/25/2024   ID:  Nation, Cradle 02-Feb-1956, MRN [id number]  PCP:  Janell Hadassah MATSU, MD   Chief Complaint  Patient presents with   12 month follow up     Doing well.     HPI:  James [name] is a  68 year-old gentleman with history of  CAD hypertension,  paroxysmal atrial fibrillation in February 2016, previously declined anticoagulation smoker 40 years,  Quit  2015 experienced substernal chest discomfort 03/20/14 and presented to [facility] Emergency Room,   EKG revealed ST elevations in the anterior leads consistent with ST elevation myocardial infarction,    Cardiac catheterization was performed,  high-grade stenosis in the distal left main and ostium of the left anterior descending coronary artery with TIMI-3 flow.   CABG at [facility].  March 25, 2014 He presents for Follow-up of his coronary artery disease , PAF  Last seen by myself in clinic 7/24 In follow-up today reports doing well He is now retired, No hobbies, does what he likes  not playing golf,  No chest pain, no SOB  Retired, used to drive bus  lab work reviewed from July 2024 A1c 5.6 Total cholesterol 80 LDL 14  Nonsmoker, quit 2015 Denies significant side effects from his medications  No sx concerning for atrial fibrillation, previously decined anticoagulation  EKG personally reviewed by myself on todays visit EKG Interpretation Date/Time:  Monday February 25 2024 08:01:12 EDT Ventricular Rate:  66 PR Interval:  172 QRS Duration:  132 QT Interval:  418 QTC Calculation: 438 R Axis:   -89  Text Interpretation: Normal sinus rhythm Right bundle branch block Anterolateral infarct (cited on or before 06-Nov-2014) When compared with ECG of 26-Feb-2023 08:23, No significant change was found Confirmed by Perla Lye [phone]) on 02/25/2024 8:07:52 AM    Other past medical history reviewed Was declining colonoscopy, Hemoccult testing performed PSA 7.04 September 2016 with  significant increase from June 2015, he was referred to urology  Echo 11/16/16 Left ventricle: The cavity size was normal. Wall thickness was  normal. Systolic function was normal. The estimated ejection   fraction was in the range of 50% to 55%. Left ventricular   diastolic function parameters were normal for the patient&'s age.  ETT 11/16/2016 Normal exercise stress test Good exercise tolerance   previously on anticoagulation, eliquis but was unable to afford this long-term Denied any bleeding complications CHADS VASC of 3 Even if he was able to afford the medication, he is not sure if he would want to be on it Not particular interested in warfarin His father is on anticoagulation   PMH:   has a past medical history of A-fib [facility]), Coronary artery disease (03/25/2014), Hyperlipidemia, Hypertension, NSTEMI (non-ST elevated myocardial infarction) (HCC), and Systolic dysfunction with heart failure (HCC).  PSH:    Past Surgical History:  Procedure Laterality Date   CARDIAC CATHETERIZATION  2015   CORONARY ARTERY BYPASS GRAFT  02/2014   [facility]    Current Outpatient Medications  Medication Sig Dispense Refill   amLODipine  (NORVASC ) 5 MG tablet TAKE 1 TABLET(5 MG) BY MOUTH DAILY 90 tablet 3   aspirin EC 81 MG tablet Take 81 mg by mouth daily.      atorvastatin  (LIPITOR) 10 MG tablet TAKE 1 TABLET(10 MG) BY MOUTH DAILY 90 tablet 3   metoprolol  tartrate (LOPRESSOR ) 100 MG tablet TAKE 1 TABLET(100 MG) BY MOUTH TWICE DAILY 180 tablet 3   sildenafil  (REVATIO ) 20 MG tablet  TAKE AS DIRECTED BY DOCTOR 90 tablet 1   telmisartan  (MICARDIS ) 40 MG tablet TAKE 1 TABLET BY MOUTH EACH DAY 90 tablet 3   No current facility-administered medications for this visit.     Allergies:   Codeine   Social History:  The patient  reports that he quit smoking about 9 years ago. His smoking use included cigarettes. He started smoking about 50 years ago. He has a 40 pack-year smoking history. He has never  used smokeless tobacco. He reports current alcohol use. He reports that he does not use drugs.   Family History:   family history includes Diabetes gravidarum in his brother and father; Heart attack (age of onset: 34) in his father; Heart disease in his father.    Review of Systems: Review of Systems  Constitutional: Negative.   Respiratory: Negative.    Cardiovascular: Negative.   Gastrointestinal: Negative.   Musculoskeletal: Negative.   Neurological: Negative.   Psychiatric/Behavioral: Negative.    All other systems reviewed and are negative.   PHYSICAL EXAM: VS:  Ht 5' 9.5 (1.765 m)   Wt 185 lb 6 oz (84.1 kg)   SpO2 95%   BMI 26.98 kg/m  , BMI Body mass index is 26.98 kg/m.  Constitutional:  oriented to person, place, and time. No distress.  HENT:  Head: Grossly normal Eyes:  no discharge. No scleral icterus.  Neck: No JVD, no carotid bruits  Cardiovascular: Regular rate and rhythm, no murmurs appreciated Pulmonary/Chest: Clear to auscultation bilaterally, no wheezes or rales Abdominal: Soft.  no distension.  no tenderness.  Musculoskeletal: Normal range of motion Neurological:  normal muscle tone. Coordination normal. No atrophy Skin: Skin warm and dry Psychiatric: normal affect, pleasant  Recent Labs: 02/26/2023: ALT 20; BUN 12; Creatinine, Ser 0.91; Hemoglobin 14.3; Platelets 276; Potassium 4.4; Sodium 138    Lipid Panel Lab Results  Component Value Date   CHOL 80 (L) 02/26/2023   HDL 37 (L) 02/26/2023   LDLCALC 14 02/26/2023   TRIG 182 (H) 02/26/2023      Wt Readings from Last 3 Encounters:  02/25/24 185 lb 6 oz (84.1 kg)  02/26/23 187 lb 2 oz (84.9 kg)  11/21/21 185 lb 8 oz (84.1 kg)     ASSESSMENT AND PLAN:  Hypertension, unspecified type Blood pressure is high end of his range, just took morning medication. no changes made to the medications.  Monitor at home  Coronary artery disease of bypass graft of native heart with stable angina pectoris  [facility]) Currently with no symptoms of angina. No further workup at this time. Continue current medication regimen. Non-smoker, cholesterol at goal, A1c well-controlled New lab work pending  Mixed hyperlipidemia Lab work ordered today, previously at goal  Smoking history Stopped smoking  2015 at the time of his MI  Paroxysmal atrial fibrillation Denies symptoms concerning for atrial fibrillation CHADS VASC of 3,  Previously declined anticoagulation , aware of stroke risk Denies any new symptoms concerning for arrhythmia Continue metoprolol  tartrate 100 twice daily   Orders Placed This Encounter  Procedures   EKG 12-Lead     Signed, Velinda Lunger, M.D., Ph.D. 02/25/2024  [facility] [place], [place] [phone]

## 2024-02-25 ENCOUNTER — Ambulatory Visit: Attending: Cardiovascular Disease | Admitting: Cardiovascular Disease

## 2024-02-25 ENCOUNTER — Encounter: Payer: Self-pay | Admitting: Cardiovascular Disease

## 2024-02-25 VITALS — BP 140/80 | HR 66 | Ht 69.5 in | Wt 185.4 lb

## 2024-02-25 DIAGNOSIS — E782 Mixed hyperlipidemia: Secondary | ICD-10-CM

## 2024-02-25 DIAGNOSIS — Z87891 Personal history of nicotine dependence: Secondary | ICD-10-CM | POA: Diagnosis not present

## 2024-02-25 DIAGNOSIS — I10 Essential (primary) hypertension: Secondary | ICD-10-CM

## 2024-02-25 DIAGNOSIS — I25708 Atherosclerosis of coronary artery bypass graft(s), unspecified, with other forms of angina pectoris: Secondary | ICD-10-CM

## 2024-02-25 DIAGNOSIS — Z79899 Other long term (current) drug therapy: Secondary | ICD-10-CM

## 2024-02-25 NOTE — Patient Instructions (Addendum)
 Medication Instructions:  No changes  If you need a refill on your cardiac medications before your next appointment, please call your pharmacy.   Lab work: Labs: cbc, lipids, A1c, CMP  Testing/Procedures: No new testing needed  Follow-Up: At [facility], you and your health needs are our priority.  As part of our continuing mission to provide you with exceptional heart care, we have created designated Provider Care Teams.  These Care Teams include your primary Cardiologist (physician) and Advanced Practice Providers (APPs -  Physician Assistants and Nurse Practitioners) who all work together to provide you with the care you need, when you need it.  You will need a follow up appointment in 12 months  Providers on your designated Care Team:   Lonni Meager, NP Bernardino Bring, PA-C Cadence [place], [place]  COVID-19 Vaccine Information can be found at: [email or web address] For questions related to vaccine distribution or appointments, please email [email or web address] or call [phone].

## 2024-02-26 LAB — CBC
Hematocrit: 44.8 % (ref 37.5–51.0)
Hemoglobin: 14.9 g/dL (ref 13.0–17.7)
MCH: 31.6 pg (ref 26.6–33.0)
MCHC: 33.3 g/dL (ref 31.5–35.7)
MCV: 95 fL (ref 79–97)
Platelets: 266 x10E3/uL (ref 150–450)
RBC: 4.71 x10E6/uL (ref 4.14–5.80)
RDW: 12.2 % (ref 11.6–15.4)
WBC: 10.9 x10E3/uL — ABNORMAL HIGH (ref 3.4–10.8)

## 2024-02-26 LAB — LIPID PANEL
Chol/HDL Ratio: 2.2 ratio (ref 0.0–5.0)
Cholesterol, Total: 81 mg/dL — ABNORMAL LOW (ref 100–199)
HDL: 37 mg/dL — ABNORMAL LOW (ref >39)
LDL Chol Calc (NIH): 23 mg/dL (ref 0–99)
Triglycerides: 113 mg/dL (ref 0–149)
VLDL Cholesterol Cal: 21 mg/dL (ref 5–40)

## 2024-02-26 LAB — HEMOGLOBIN A1C
Est. average glucose Bld gHb Est-mCnc: 117 mg/dL
Hgb A1c MFr Bld: 5.7 % — ABNORMAL HIGH (ref 4.8–5.6)

## 2024-02-26 LAB — COMPREHENSIVE METABOLIC PANEL WITH GFR
ALT: 21 IU/L (ref 0–44)
AST: 21 IU/L (ref 0–40)
Albumin: 4.6 g/dL (ref 3.9–4.9)
Alkaline Phosphatase: 79 IU/L (ref 44–121)
BUN/Creatinine Ratio: 14 (ref 10–24)
BUN: 13 mg/dL (ref 8–27)
Bilirubin Total: 0.4 mg/dL (ref 0.0–1.2)
CO2: 19 mmol/L — ABNORMAL LOW (ref 20–29)
Calcium: 9.3 mg/dL (ref 8.6–10.2)
Chloride: 106 mmol/L (ref 96–106)
Creatinine, Ser: 0.94 mg/dL (ref 0.76–1.27)
Globulin, Total: 2.6 g/dL (ref 1.5–4.5)
Glucose: 114 mg/dL — ABNORMAL HIGH (ref 70–99)
Potassium: 4.6 mmol/L (ref 3.5–5.2)
Sodium: 142 mmol/L (ref 134–144)
Total Protein: 7.2 g/dL (ref 6.0–8.5)
eGFR: 88 mL/min/1.73 (ref >59)

## 2024-03-01 ENCOUNTER — Ambulatory Visit: Payer: Self-pay | Admitting: Cardiovascular Disease

## 2024-06-24 DIAGNOSIS — R972 Elevated prostate specific antigen [PSA]: Secondary | ICD-10-CM | POA: Insufficient documentation

## 2024-06-24 NOTE — Assessment & Plan Note (Addendum)
 Reportedly elevated PSA  - last PSA 7.5 (in 2018)  Baseline LUTS with history of postoperative urinary retention, as well as prior prostatitis.  Denies new or acute LUTS today  We discussed the significance of an elevated prostate-specific antigen (PSA) level. PSA is a nonspecific marker and may be elevated due to both benign and malignant causes. Benign factors include benign prostatic hyperplasia (BPH), prostatitis or urinary tract infection, recent ejaculation, catheterization or instrumentation, and advancing age. Malignant causes include prostate cancer of varying risk categories.  We reviewed that a single PSA value is less informative than following PSA trends over time, which can better reflect underlying pathology. Risk factors for prostate cancer include increasing age, family history of prostate cancer, African American race, and known germline mutations (e.g., BRCA2).  Next steps may include repeating PSA to confirm elevation, consideration of additional biomarkers or PSA derivatives (e.g., %free PSA, PSA density), and obtaining a multiparametric prostate MRI to assess for suspicious lesions. Based on PSA kinetics, risk profile, and MRI findings, a prostate biopsy may be recommended for definitive diagnosis.   -Will check UA considering history of prostatitis/possible UTI -may help decipher PSA data - repeat PSA w/ %free - RTC in 1-2 weeks to review

## 2024-06-24 NOTE — Progress Notes (Signed)
 06/30/24 8:21 AM   James [name] 1956-06-07 [id number]   HPI: 68 y.o. male here for initial evaluation of elevated PSA Referral documents did not include PSA   PSA was around 7 range in 2018 -no other available data No family history of prostate cancer History of postoperative urinary retention after CABG Denies acute LUTS  History of A-fib, CAD, prior NSTEMI, systolic heart failure, CABG x2 Prior smoker for 30-40 years Current 4 beer a day alcohol drinker    PMH: Past Medical History:  Diagnosis Date   A-fib (HCC)    Coronary artery disease 03/25/2014   CABG w/single artery graft @ [facility]    Hyperlipidemia    Hypertension    NSTEMI (non-ST elevated myocardial infarction) (HCC)    Systolic dysfunction with heart failure (HCC)     Surgical History: Past Surgical History:  Procedure Laterality Date   CARDIAC CATHETERIZATION  2015   CORONARY ARTERY BYPASS GRAFT  02/2014   [facility]    Family History: Family History  Problem Relation Age of Onset   Heart attack Father 70   Heart disease Father        CABG    Diabetes gravidarum Father    Diabetes gravidarum Brother     Social History:  reports that he quit smoking about 10 years ago. His smoking use included cigarettes. He started smoking about 50 years ago. He has a 40 pack-year smoking history. He has never used smokeless tobacco. He reports current alcohol use. He reports that he does not use drugs.      Physical Exam: BP (!) 150/83   Pulse 70   Ht 5' 9.5 (1.765 m)   Wt 184 lb (83.5 kg)   BMI 26.78 kg/m    Constitutional:  Alert and oriented, No acute distress. Cardiovascular: No clubbing, cyanosis, or edema. Respiratory: Normal respiratory effort, no increased work of breathing. GI: Nondistended GU: Patient declined DRE Skin: No rashes, bruises or suspicious lesions. Neurologic: Grossly intact, no focal deficits, moving all 4 extremities. Psychiatric: Normal mood and affect.  Laboratory  Data: Component Ref Range & Units 7 yr ago  PSA (Prostate Specific Antigen), Total 0.10 - 4.00 ng/mL 7.54 High      Pertinent Imaging: N/A    Assessment & Plan:    Elevated PSA Assessment & Plan: Reportedly elevated PSA  - last PSA 7.5 (in 2018)  Baseline LUTS with history of postoperative urinary retention, as well as prior prostatitis.  Denies new or acute LUTS today  We discussed the significance of an elevated prostate-specific antigen (PSA) level. PSA is a nonspecific marker and may be elevated due to both benign and malignant causes. Benign factors include benign prostatic hyperplasia (BPH), prostatitis or urinary tract infection, recent ejaculation, catheterization or instrumentation, and advancing age. Malignant causes include prostate cancer of varying risk categories.  We reviewed that a single PSA value is less informative than following PSA trends over time, which can better reflect underlying pathology. Risk factors for prostate cancer include increasing age, family history of prostate cancer, African American race, and known germline mutations (e.g., BRCA2).  Next steps may include repeating PSA to confirm elevation, consideration of additional biomarkers or PSA derivatives (e.g., %free PSA, PSA density), and obtaining a multiparametric prostate MRI to assess for suspicious lesions. Based on PSA kinetics, risk profile, and MRI findings, a prostate biopsy may be recommended for definitive diagnosis.   -Will check UA considering history of prostatitis/possible UTI -may help decipher PSA data - repeat  PSA w/ %free - RTC in 1-2 weeks to review  Orders: -     Urinalysis, Complete -     PSA, total and free; Future  Benign prostatic hyperplasia with lower urinary tract symptoms, symptom details unspecified Assessment & Plan: Reported history of postoperative AUR and prior prostatitis  - Will continue workup for possible underlying BPH, following elevated PSA  investigation  Orders: -     Urinalysis, Complete      Penne Skye, MD 06/30/2024  [facility] [address], [address] [place], [place] [address] [phone]

## 2024-06-30 ENCOUNTER — Encounter: Payer: Self-pay | Admitting: Urology

## 2024-06-30 ENCOUNTER — Ambulatory Visit: Admitting: Urology

## 2024-06-30 VITALS — BP 150/83 | HR 70 | Ht 69.5 in | Wt 184.0 lb

## 2024-06-30 DIAGNOSIS — R972 Elevated prostate specific antigen [PSA]: Secondary | ICD-10-CM

## 2024-06-30 DIAGNOSIS — N401 Enlarged prostate with lower urinary tract symptoms: Secondary | ICD-10-CM

## 2024-06-30 DIAGNOSIS — N400 Enlarged prostate without lower urinary tract symptoms: Secondary | ICD-10-CM | POA: Insufficient documentation

## 2024-06-30 LAB — URINALYSIS, COMPLETE
Bilirubin, UA: NEGATIVE
Glucose, UA: NEGATIVE
Ketones, UA: NEGATIVE
Leukocytes,UA: NEGATIVE
Nitrite, UA: NEGATIVE
Protein,UA: NEGATIVE
Specific Gravity, UA: 1.025 (ref 1.005–1.030)
Urobilinogen, Ur: 0.2 mg/dL (ref 0.2–1.0)
pH, UA: 6 (ref 5.0–7.5)

## 2024-06-30 LAB — MICROSCOPIC EXAMINATION: Bacteria, UA: NONE SEEN

## 2024-06-30 NOTE — Assessment & Plan Note (Signed)
 Reported history of postoperative AUR and prior prostatitis  - Will continue workup for possible underlying BPH, following elevated PSA investigation

## 2024-07-01 LAB — PSA, TOTAL AND FREE
PSA, Free Pct: 4.4 %
PSA, Free: 0.89 ng/mL
Prostate Specific Ag, Serum: 20.1 ng/mL — ABNORMAL HIGH (ref 0.0–4.0)

## 2024-07-09 NOTE — Assessment & Plan Note (Signed)
 Reportedly elevated PSA  - last PSA 7.5 (in 2018)  Recent PSA returned 20.1  - %free 4.4  Reviewed his recent PSA which was quite elevated at 20.1, along with a concerning percent free PSA, that raises suspicion for underlying clinically significant prostate cancer.  I would recommend proceeding with a dedicated MRI followed by targeted biopsies.  I explained the rationale for imaging and possible biopsy procedures with him today.  All questions answered and he was amenable to proceed.  - Plan to proceed with MRI prostate.  If positive, we will perform targeted fusion biopsy.  If negative, plan to perform 12 core systematic biopsy

## 2024-07-09 NOTE — Progress Notes (Signed)
 07/14/2024 8:14 AM   James [name] 1955/08/23 [id number]  Reason for visit: Follow up elevated PSA   HPI: 68 y.o. male, follow up with me today  Recent PSA returned 20.1  - %free 4.4  Prior HPI: Referral documents did not include PSA    PSA was around 7 range in 2018 -no other available data No family history of prostate cancer History of postoperative urinary retention after CABG Denies acute LUTS   History of A-fib, CAD, prior NSTEMI, systolic heart failure, CABG x2 Prior smoker for 30-40 years Current 4 beer a day alcohol drinke    Physical Exam: BP (!) 150/82   Pulse 65   Ht 5' 9 (1.753 m)   Wt 184 lb (83.5 kg)   BMI 27.17 kg/m    Constitutional:  Alert and oriented, No acute distress. GU: Patient deferred  Laboratory Data: Component Ref Range & Units (hover) 9 d ago  Prostate Specific Ag, Serum 20.1 High   Comment: Roche ECLIA methodology. According to the American Urological Association, Serum PSA should decrease and remain at undetectable levels after radical prostatectomy. The AUA defines biochemical recurrence as an initial PSA value 0.2 ng/mL or greater followed by a subsequent confirmatory PSA value 0.2 ng/mL or greater. Values obtained with different assay methods or kits cannot be used interchangeably. Results cannot be interpreted as absolute evidence of the presence or absence of malignant disease.  PSA, Free 0.89  Comment: Roche ECLIA methodology.  PSA, Free Pct 4.4  Comment: The table below lists the probability of prostate cancer for men with non-suspicious DRE results and total PSA between 4 and 10 ng/mL, by patient age [name], JAMA 1998, 720:8457).                   % Free PSA       50-64 yr        65-75 yr                   0.00-10.00%        56%             55%                  10.01-15.00%        24%             35%                  15.01-20.00%        17%             23%                  20.01-25.00%        10%              20%                       >25.00%         5%              9%    Pertinent Imaging: N/A    Assessment & Plan:    Elevated PSA Assessment & Plan: Reportedly elevated PSA  - last PSA 7.5 (in 2018)  Recent PSA returned 20.1  - %free 4.4  Reviewed his recent PSA which was quite elevated at 20.1, along with a concerning percent free PSA, that raises suspicion for underlying clinically significant prostate cancer.  I would recommend proceeding with a dedicated MRI followed by targeted biopsies.  I explained the rationale for imaging and possible biopsy procedures with him today.  All questions answered and he was amenable to proceed.  - Plan to proceed with MRI prostate.  If positive, we will perform targeted fusion biopsy.  If negative, plan to perform 12 core systematic biopsy  Orders: -     MR PROSTATE W WO CONTRAST; Future       [name], MD  [facility] [address] [place], [place] [address] [phone]

## 2024-07-14 ENCOUNTER — Ambulatory Visit: Admitting: Urology

## 2024-07-14 VITALS — BP 150/82 | HR 65 | Ht 69.0 in | Wt 184.0 lb

## 2024-07-14 DIAGNOSIS — R972 Elevated prostate specific antigen [PSA]: Secondary | ICD-10-CM

## 2024-07-14 NOTE — Patient Instructions (Signed)
 Please contact Central Scheduling to set up your prostate MRI at [phone].  Prostate MRI Prep:  1- No ejaculation 48 hours prior to exam  2- No caffeine or carbonated beverages on day of the exam  3- Eat light diet evening prior and day of exam  4- Avoid eating 4 hours prior to exam  5- Fleets enema needs to be done 4 hours prior to exam -See below. Can be purchased at the drug store.

## 2024-07-16 ENCOUNTER — Ambulatory Visit

## 2024-07-21 ENCOUNTER — Ambulatory Visit
Admission: RE | Admit: 2024-07-21 | Discharge: 2024-07-21 | Disposition: A | Discharge status: Home / Self Care | Source: Ambulatory Visit | Attending: Urology | Admitting: Urology

## 2024-07-21 DIAGNOSIS — R972 Elevated prostate specific antigen [PSA]: Secondary | ICD-10-CM | POA: Insufficient documentation

## 2024-07-21 MED ORDER — GADOBUTROL 1 MMOL/ML IV SOLN
7.5000 mL | Freq: Once | INTRAVENOUS | Status: AC | PRN
  Administered 2024-07-21: 7.5 mL via INTRAVENOUS

## 2024-08-05 NOTE — Assessment & Plan Note (Signed)
 PSA 20.1 (Dec 2025)  - %free 4.4  - last PSA 7.5 (in 2018)  - prostate MRI (07/21/24) - PIRADS 5 at left anterior TZ  Reviewed his recent prostate MRI which revealed a concerning lesion.  There is a high likelihood of underlying clinically significant prostate cancer.  Recommend proceeding with a targeted MRI fusion biopsy.  Briefly, I reviewed the possible outcomes, including benign disease versus varying stratification's of prostate cancer.  He would follow-up with me in 7-10 days following biopsy to discuss pathology results.  - Plan to proceed with MRI fusion biopsy. Explained indications and rationale, patient instructions provided.  All questions answered today.

## 2024-08-05 NOTE — Progress Notes (Signed)
" ° °  08/07/2024 8:18 AM   James [name] 13-Jan-1956 [id number]  Reason for visit: Follow up elevated PSA   HPI: 68 y.o. male, follow up with me today  Recent PSA returned 20.1  - %free 4.4  S/p prostate MRI (07/21/24) - PIRADS 5 at left anterior TZ   Prior HPI: Referral documents did not include PSA    PSA was around 7 range in 2018 -no other available data No family history of prostate cancer History of postoperative urinary retention after CABG Denies acute LUTS   History of A-fib, CAD, prior NSTEMI, systolic heart failure, CABG x2 Prior smoker for 30-40 years Current 4 beer a day alcohol drinke    Physical Exam: BP 135/82   Pulse 69   Ht 5' 9 (1.753 m)   Wt 184 lb (83.5 kg)   SpO2 96%   BMI 27.17 kg/m    Constitutional:  Alert and oriented, No acute distress.  Laboratory Data: Component Ref Range & Units (hover) 1 mo ago  Prostate Specific Ag, Serum 20.1 High     Pertinent Imaging: I have personally viewed and interpreted the MRI prostate (07/21/24) - agree with read, concerning PIRADS 5 lesion at left anterior TZ, 25g gland  IMPRESSION: 1. PI-RADS category 5 lesion in the left anterior transition zone and left anterior fibromuscular stroma in the base and mid gland, measuring 1.74 cubic cm, with greater than 1.5 cm of capsular contact raising risk of occult transcapsular spread. 2. Incidental sigmoid colon diverticulosis..    Assessment & Plan:    Elevated PSA Assessment & Plan: PSA 20.1 (Dec 2025)  - %free 4.4  - last PSA 7.5 (in 2018)  - prostate MRI (07/21/24) - PIRADS 5 at left anterior TZ  Reviewed his recent prostate MRI which revealed a concerning lesion.  There is a high likelihood of underlying clinically significant prostate cancer.  Recommend proceeding with a targeted MRI fusion biopsy.  Briefly, I reviewed the possible outcomes, including benign disease versus varying stratification's of prostate cancer.  He would follow-up with me  in 7-10 days following biopsy to discuss pathology results.  - Plan to proceed with MRI fusion biopsy. Explained indications and rationale, patient instructions provided.  All questions answered today.   Benign prostatic hyperplasia with lower urinary tract symptoms, symptom details unspecified Assessment & Plan: Reported history of postoperative AUR and prior prostatitis Subacute progression in LUTS, nocturia, subj weak stream   - 25g BPH via MRI   - Will start Flomax  now, while we continue elevated PSA workup as below. R/B/SEs reviewed   Orders: -     Tamsulosin  HCl; Take 1 capsule (0.4 mg total) by mouth daily.  Dispense: 30 capsule; Refill: 3       James JONELLE Skye, MD  [facility] [address] [place], [place] [address] [phone] "

## 2024-08-07 ENCOUNTER — Telehealth: Payer: Self-pay | Admitting: Cardiovascular Disease

## 2024-08-07 ENCOUNTER — Ambulatory Visit: Admitting: Urology

## 2024-08-07 VITALS — BP 135/82 | HR 69 | Ht 69.0 in | Wt 184.0 lb

## 2024-08-07 DIAGNOSIS — N401 Enlarged prostate with lower urinary tract symptoms: Secondary | ICD-10-CM | POA: Diagnosis not present

## 2024-08-07 DIAGNOSIS — R972 Elevated prostate specific antigen [PSA]: Secondary | ICD-10-CM

## 2024-08-07 MED ORDER — TAMSULOSIN HCL 0.4 MG PO CAPS: 0.4000 mg | ORAL_CAPSULE | Freq: Every day | ORAL | 3 refills | Status: AC

## 2024-08-07 MED ORDER — TAMSULOSIN HCL 0.4 MG PO CAPS: 0.4000 mg | ORAL_CAPSULE | Freq: Every day | ORAL | 3 refills | Status: DC

## 2024-08-07 NOTE — Patient Instructions (Signed)
 Transrectal Prostate Biopsy/Fusion Biopsy Patient Education and Post Procedure Instructions    -Definition A prostate biopsy is the removal of a small amount of tissue from the prostate gland. The tissue is examined to determine whether there is cancer.  -Reasons for Procedure A prostate biopsy is usually done after an abnormal finding by: Digital rectal exam Prostate specific antigen (PSA) blood test A prostate biopsy is the only way to find out if cancer cells are present.  -Possible Complications Problems from the procedure are rare, but all procedures have some risk including: Infection Bruising or lengthy bleeding from the rectum, or in urine or semen Difficulty urinating Reactions to anesthesia Factors that may increase the risk of complications include: Smoking History of bleeding disorders or easy bruising Use of any medications, over-the-counter medications, or herbal supplements Sensitivity or allergy to latex, medications, or anesthesia.  -Prior to Procedure Talk to your doctor about your medications. Blood thinning medications including aspirin should be stopped 1 week prior to procedure. If prescribed by your cardiologist we may need approval before stopping medications. Use a Fleets enema 2 hours before the procedure. Can be purchased at your pharmacy. Antibiotics will be administered in the clinic prior to procedure.  Please make sure you eat a light meal prior to coming in for your appointment. This can help prevent lightheadedness during the procedure and upset stomach from antibiotics. Please bring someone with you to the procedure to drive you home.  -Anesthesia Transrectal biopsy: Local anesthesia--Just the area that is being operated on is numbed using an injectable anesthetic.  -Description of the Procedure Transrectal biopsy--Your doctor will insert a small ultrasound device into the rectum. This device will produce sound waves to create an image of the  prostate. These images will help guide placement of the needle. Your doctor will then insert the needle through the wall of the rectum and into the prostate gland. The procedure should take approximately 15-30 minutes.  -Will It Hurt? You may have discomfort and soreness at the biopsy site. Pain and discomfort after the procedure can be managed with medications.  -Postoperative Care When you return home after the procedure, do the following to help ensure a smooth recovery: Stay hydrated. Drink plenty of fluids for the next few days. Avoid difficult physical activity the day and evening of the procedure. Keep in mind that you may see blood in your urine, stool, or semen for several days. Resume any medications that were stopped when you are advised to do so.  After the sample is taken, it will be sent to a pathologist for examination under a microscope. This doctor will analyze the sample for cancer. You will be scheduled for an appointment to discuss results. If cancer is present, your doctor will work with you to develop a treatment plan.   -Call Your Doctor or Seek Immediate Medical Attention It is important to monitor your recovery. Alert your doctor to any problems. If any of the following occur, call your doctor or go to the emergency room: Fever 100.5 or greater within 1 week post procedure go directly to ER Call the office for: Blood in the urine more than 1 week or in semen for more than 6 weeks post-biopsy Pain that you cannot control with the medications you have been given Pain, burning, urgency, or frequency of urination Cough, shortness of breath, or chest pain- if severe go to ER Heavy rectal bleeding or bleeding that lasts more than 1 week after the biopsy If you have  any questions or concerns please contact our office at [phone]  [facility]  [address]  [place] , [place], [address] [phone]

## 2024-08-07 NOTE — Telephone Encounter (Signed)
"  ° °  Pre-operative Risk Assessment    Patient Name: James [name]  DOB: 10-04-1955 MRN: [id number]   Date of last office visit: unknown Date of next office visit: unknown   Request for Surgical Clearance    Procedure:  fusion biopsy  Date of Surgery:  Clearance 09/18/24                                Surgeon:  unknown Surgeon's Group or Practice Name:  [facility] Phone number:  [phone] Fax number:  [phone]   Type of Clearance Requested:   - Pharmacy:  Hold Aspirin 7 days prior   Type of Anesthesia:  Not Indicated   Additional requests/questions:    Bonney Berwyn LELON Jackson   08/07/2024, 12:16 PM   "

## 2024-08-07 NOTE — Assessment & Plan Note (Addendum)
 Reported history of postoperative AUR and prior prostatitis Subacute progression in LUTS, nocturia, subj weak stream   - 25g BPH via MRI   - Will start Flomax  now, while we continue elevated PSA workup as below. R/B/SEs reviewed

## 2024-08-07 NOTE — Telephone Encounter (Signed)
 Attempted to call patient to schedule an televisit appointment for a pre-op clearance. No answer left a vm to call back.

## 2024-08-07 NOTE — Telephone Encounter (Signed)
" ° °  Name: James [name]  DOB: 1956-06-18  MRN: [id number]  Primary Cardiologist: Evalene Lunger, MD   Preoperative team, please contact this patient and set up a phone call appointment for further preoperative risk assessment. Please obtain consent and complete medication review. Thank you for your help.  I confirm that guidance regarding antiplatelet and oral anticoagulation therapy has been completed and, if necessary, noted below.  Regarding ASA therapy, we recommend continuation of ASA throughout the perioperative period.  However, if the surgeon feels that cessation of ASA is required in the perioperative period, it may be stopped 5-7 days prior to surgery with a plan to resume it as soon as felt to be feasible from a surgical standpoint in the post-operative period.  I also confirmed the patient resides in the state of [place] . As per [facility] Medical Board telemedicine laws, the patient must reside in the state in which the provider is licensed.   James JAYSON Braver, NP 08/07/2024, 12:30 PM [facility]    "

## 2024-08-11 NOTE — Telephone Encounter (Signed)
 Attempted to reach patient but phone just rang. Will try again later.

## 2024-08-12 NOTE — Telephone Encounter (Signed)
 3rd attempt to reach pt regarding surgical clearance and the need for an TELE appointment.  Phone rang continously and said call cannot be completed at this time, please hang up and call again. Will update surgeon's office as this is the third attempt and remove from preop pool.

## 2024-08-26 ENCOUNTER — Ambulatory Visit: Attending: Cardiovascular Disease | Admitting: Cardiovascular Disease

## 2024-08-26 ENCOUNTER — Encounter: Payer: Self-pay | Admitting: Cardiovascular Disease

## 2024-08-26 VITALS — BP 120/60 | HR 72 | Ht 69.0 in | Wt 187.4 lb

## 2024-08-26 DIAGNOSIS — I25708 Atherosclerosis of coronary artery bypass graft(s), unspecified, with other forms of angina pectoris: Secondary | ICD-10-CM | POA: Diagnosis not present

## 2024-08-26 DIAGNOSIS — I110 Hypertensive heart disease with heart failure: Secondary | ICD-10-CM

## 2024-08-26 DIAGNOSIS — Z01818 Encounter for other preprocedural examination: Secondary | ICD-10-CM

## 2024-08-26 DIAGNOSIS — I10 Essential (primary) hypertension: Secondary | ICD-10-CM

## 2024-08-26 DIAGNOSIS — E782 Mixed hyperlipidemia: Secondary | ICD-10-CM | POA: Diagnosis not present

## 2024-08-26 DIAGNOSIS — Z87891 Personal history of nicotine dependence: Secondary | ICD-10-CM

## 2024-08-26 DIAGNOSIS — I5043 Acute on chronic combined systolic (congestive) and diastolic (congestive) heart failure: Secondary | ICD-10-CM

## 2024-08-26 MED ORDER — TELMISARTAN 40 MG PO TABS: ORAL_TABLET | ORAL | 3 refills | Status: AC

## 2024-08-26 MED ORDER — ATORVASTATIN CALCIUM 10 MG PO TABS: ORAL_TABLET | ORAL | 3 refills | Status: AC

## 2024-08-26 MED ORDER — AMLODIPINE BESYLATE 5 MG PO TABS: ORAL_TABLET | ORAL | 3 refills | Status: AC

## 2024-08-26 MED ORDER — METOPROLOL TARTRATE 100 MG PO TABS: ORAL_TABLET | ORAL | 3 refills | Status: AC

## 2024-08-26 NOTE — Patient Instructions (Addendum)
 Medication Instructions:  Please hold aspirin 7 days prior to prostate procedure  (feb 12th) Restart after procedure  If you need a refill on your cardiac medications before your next appointment, please call your pharmacy.   Lab work: No new labs needed  Testing/Procedures: No new testing needed  Follow-Up: At [facility], you and your health needs are our priority.  As part of our continuing mission to provide you with exceptional heart care, we have created designated Provider Care Teams.  These Care Teams include your primary Cardiologist (physician) and Advanced Practice Providers (APPs -  Physician Assistants and Nurse Practitioners) who all work together to provide you with the care you need, when you need it.  You will need a follow up appointment in 12 months  Providers on your designated Care Team:   Lonni Meager, NP Bernardino Bring, PA-C Cadence [place], [place]  COVID-19 Vaccine Information can be found at: [email or web address] For questions related to vaccine distribution or appointments, please email [email or web address] or call [phone].

## 2024-08-26 NOTE — Progress Notes (Signed)
 Cardiology Office Note  Date:  08/26/2024   ID:  James [name], James [name] October 18, 1955, MRN [id number]  PCP:  James Hadassah MATSU, MD   Chief Complaint  Patient presents with   Pre op clearance for a fusion biopsy     Patient will need to hold the aspirin 81 mg 7 days prior to the biopsy. The biopsy is Feb. 19, 2026. Patient denies chest pain or shortness of breath.     HPI:  [name] is a  69 year-old gentleman with history of  CAD hypertension,  paroxysmal atrial fibrillation in February 2016, previously declined anticoagulation smoker 40 years,  Quit  2015 experienced substernal chest discomfort 03/20/14 and presented to [facility] Emergency Room,   EKG revealed ST elevations in the anterior leads consistent with ST elevation myocardial infarction,    Cardiac catheterization was performed,  high-grade stenosis in the distal left main and ostium of the left anterior descending coronary artery with TIMI-3 flow.   CABG at [facility].  March 25, 2014 Four beer a day drinker Ejection fraction 50 to 55% March 2018 He presents for Follow-up of his coronary artery disease , PAF  Last seen by myself in clinic 7/25 Followed by urology Recent imaging discussed prostate MRI which revealed a concerning lesion  Recommendation made for targeted MRI fusion biopsy, tentatively scheduled for February 19 he reports  Sedentary at home Has tremor, lost coordination No hobbies, not playing golf,  No chest pain, no SOB on exertion Retired, used to drive bus  lab work reviewed from July 2024 A1c 5.7 Total cholesterol 81 LDL 23  Nonsmoker, quit 2015  Denies paroxysmal tachycardia concerning for arrhythmia, atrial fibrillation previously decined anticoagulation  EKG personally reviewed by myself on todays visit EKG Interpretation Date/Time:  Tuesday August 26 2024 14:06:43 EST Ventricular Rate:  72 PR Interval:  176 QRS Duration:  132 QT Interval:  420 QTC Calculation: 459 R  Axis:   -30  Text Interpretation: Normal sinus rhythm Left axis deviation Right bundle branch block Anterior infarct (cited on or before 06-Nov-2014) T wave abnormality, consider lateral ischemia When compared with ECG of 25-Feb-2024 08:01, No significant change was found Unchanged from prior studies dating back 10 years Confirmed by James [name] ([id number]) on 08/26/2024 2:08:33 PM   Other past medical history reviewed Was declining colonoscopy, Hemoccult testing performed PSA 7.04 September 2016 with significant increase from June 2015, he was referred to urology  Echo 11/16/16 Left ventricle: The cavity size was normal. Wall thickness was  normal. Systolic function was normal. The estimated ejection   fraction was in the range of 50% to 55%. Left ventricular   diastolic function parameters were normal for the patient&'s age.  ETT 11/16/2016 Normal exercise stress test Good exercise tolerance   previously on anticoagulation, eliquis but was unable to afford this long-term Denied any bleeding complications CHADS VASC of 3 Even if he was able to afford the medication, he is not sure if he would want to be on it Not particular interested in warfarin His father is on anticoagulation   PMH:   has a past medical history of A-fib [facility]), Coronary artery disease (03/25/2014), Hyperlipidemia, Hypertension, NSTEMI (non-ST elevated myocardial infarction) (HCC), and Systolic dysfunction with heart failure (HCC).  PSH:    Past Surgical History:  Procedure Laterality Date   CARDIAC CATHETERIZATION  2015   CORONARY ARTERY BYPASS GRAFT  02/2014   [facility]    Current Outpatient Medications  Medication Sig Dispense Refill  amLODipine  (NORVASC ) 5 MG tablet TAKE 1 TABLET(5 MG) BY MOUTH DAILY 90 tablet 3   aspirin EC 81 MG tablet 1 tablet Orally Once a day     atorvastatin  (LIPITOR) 10 MG tablet TAKE 1 TABLET(10 MG) BY MOUTH DAILY 90 tablet 3   metoprolol  tartrate (LOPRESSOR ) 100 MG tablet TAKE 1  TABLET(100 MG) BY MOUTH TWICE DAILY 180 tablet 3   sildenafil  (REVATIO ) 20 MG tablet TAKE AS DIRECTED BY DOCTOR 90 tablet 1   tamsulosin  (FLOMAX ) 0.4 MG CAPS capsule Take 1 capsule (0.4 mg total) by mouth daily. 30 capsule 3   telmisartan  (MICARDIS ) 40 MG tablet TAKE 1 TABLET BY MOUTH EACH DAY 90 tablet 3   No current facility-administered medications for this visit.   Allergies:   Codeine   Social History:  The patient  reports that he quit smoking about 10 years ago. His smoking use included cigarettes. He started smoking about 50 years ago. He has a 40 pack-year smoking history. He has never used smokeless tobacco. He reports current alcohol use. He reports that he does not use drugs.   Family History:   family history includes Diabetes gravidarum in his brother and father; Heart attack (age of onset: 63) in his father; Heart disease in his father.   Review of Systems: Review of Systems  Constitutional: Negative.   Respiratory: Negative.    Cardiovascular: Negative.   Gastrointestinal: Negative.   Musculoskeletal: Negative.   Neurological: Negative.   Psychiatric/Behavioral: Negative.    All other systems reviewed and are negative.  PHYSICAL EXAM: VS:  BP 120/60 (BP Location: Left Arm, Patient Position: Sitting, Cuff Size: Normal)   Ht 5' 9 (1.753 m)   Wt 187 lb 6 oz (85 kg)   SpO2 97%   BMI 27.67 kg/m  , BMI Body mass index is 27.67 kg/m.  Constitutional:  oriented to person, place, and time. No distress.  HENT:  Head: Normocephalic and atraumatic.  Eyes:  no discharge. No scleral icterus.  Neck: Normal range of motion. Neck supple. No JVD present.  Cardiovascular: Normal rate, regular rhythm, normal heart sounds and intact distal pulses. Exam reveals no gallop and no friction rub. No edema No murmur heard. Pulmonary/Chest: Effort normal and breath sounds normal. No stridor. No respiratory distress.  no wheezes.  no rales.  no tenderness.  Abdominal: Soft.  no distension.   no tenderness.  Musculoskeletal: Normal range of motion.  no  tenderness or deformity.  Neurological:  normal muscle tone. Coordination normal. No atrophy Skin: Skin is warm and dry. No rash noted. not diaphoretic.  Psychiatric:  normal mood and affect. behavior is normal. Thought content normal.   Recent Labs: 02/25/2024: ALT 21; BUN 13; Creatinine, Ser 0.94; Hemoglobin 14.9; Platelets 266; Potassium 4.6; Sodium 142    Lipid Panel Lab Results  Component Value Date   CHOL 81 (L) 02/25/2024   HDL 37 (L) 02/25/2024   LDLCALC 23 02/25/2024   TRIG 113 02/25/2024     Wt Readings from Last 3 Encounters:  08/26/24 187 lb 6 oz (85 kg)  08/07/24 184 lb (83.5 kg)  07/14/24 184 lb (83.5 kg)   ASSESSMENT AND PLAN: Preop cardiovascular evaluation Scheduled for prostate biopsy with urology Acceptable risk to hold aspirin 7 days prior to procedure No further cardiac testing needed  Essential hypertension Blood pressure is well controlled on today's visit. No changes made to the medications.  Coronary artery disease of bypass graft of native heart with stable angina pectoris (HCC)  Currently with no symptoms of angina. No further workup at this time. Continue current medication regimen. Non-smoker, diabetes numbers well-controlled, cholesterol well-controlled  Mixed hyperlipidemia Cholesterol is at goal on the current lipid regimen. No changes to the medications were made.  Smoking history Stopped smoking  2015 at the time of his MI  Paroxysmal atrial fibrillation Remote history of atrial fibrillation CHADS VASC of 3,  Previously declined anticoagulation , aware of stroke risk -Continue metoprolol  100 twice daily   Orders Placed This Encounter  Procedures   EKG 12-Lead     Signed, Velinda Lunger, M.D., Ph.D. 08/26/2024  [facility] [place], [place] [phone]

## 2024-09-18 ENCOUNTER — Other Ambulatory Visit: Admitting: Urology

## 2024-10-01 ENCOUNTER — Ambulatory Visit: Admitting: Urology
